# Patient Record
Sex: Male | Born: 1965 | Race: White | Hispanic: No | Marital: Single | Smoking: Never smoker
Health system: Southern US, Community
[De-identification: ages and names within clinical notes are randomized; demographics above are authoritative.]

## PROBLEM LIST (undated history)

## (undated) DIAGNOSIS — E119 Type 2 diabetes mellitus without complications: Secondary | ICD-10-CM

## (undated) DIAGNOSIS — I10 Essential (primary) hypertension: Secondary | ICD-10-CM

---

## 2011-08-16 ENCOUNTER — Ambulatory Visit: Payer: Self-pay | Admitting: Orthopedic Surgery

## 2014-01-15 ENCOUNTER — Emergency Department: Payer: Self-pay | Admitting: Emergency Medicine

## 2014-01-23 ENCOUNTER — Emergency Department: Payer: Self-pay | Admitting: Emergency Medicine

## 2014-01-23 LAB — COMPREHENSIVE METABOLIC PANEL
ALBUMIN: 4.1 g/dL (ref 3.4–5.0)
ALT: 22 U/L
Alkaline Phosphatase: 54 U/L
Anion Gap: 4 — ABNORMAL LOW (ref 7–16)
BILIRUBIN TOTAL: 0.6 mg/dL (ref 0.2–1.0)
BUN: 10 mg/dL (ref 7–18)
CHLORIDE: 110 mmol/L — AB (ref 98–107)
CO2: 27 mmol/L (ref 21–32)
CREATININE: 0.87 mg/dL (ref 0.60–1.30)
Calcium, Total: 8.5 mg/dL (ref 8.5–10.1)
EGFR (African American): >60
Glucose: 114 mg/dL — ABNORMAL HIGH (ref 65–99)
OSMOLALITY: 281 (ref 275–301)
Potassium: 3.3 mmol/L — ABNORMAL LOW (ref 3.5–5.1)
SGOT(AST): 18 U/L (ref 15–37)
SODIUM: 141 mmol/L (ref 136–145)
TOTAL PROTEIN: 7.7 g/dL (ref 6.4–8.2)

## 2014-01-23 LAB — CBC WITH DIFFERENTIAL/PLATELET
BASOS ABS: 0.1 10*3/uL (ref 0.0–0.1)
Basophil %: 0.8 %
EOS PCT: 1.4 %
Eosinophil #: 0.1 10*3/uL (ref 0.0–0.7)
HCT: 42.1 % (ref 40.0–52.0)
HGB: 14.9 g/dL (ref 13.0–18.0)
LYMPHS ABS: 2.2 10*3/uL (ref 1.0–3.6)
Lymphocyte %: 29.2 %
MCH: 32.4 pg (ref 26.0–34.0)
MCHC: 35.3 g/dL (ref 32.0–36.0)
MCV: 92 fL (ref 80–100)
MONO ABS: 0.5 x10 3/mm (ref 0.2–1.0)
Monocyte %: 6.2 %
Neutrophil #: 4.8 10*3/uL (ref 1.4–6.5)
Neutrophil %: 62.4 %
PLATELETS: 171 10*3/uL (ref 150–440)
RBC: 4.59 10*6/uL (ref 4.40–5.90)
RDW: 12.8 % (ref 11.5–14.5)
WBC: 7.7 10*3/uL (ref 3.8–10.6)

## 2014-01-28 LAB — CULTURE, BLOOD (SINGLE)

## 2014-01-29 ENCOUNTER — Emergency Department: Payer: Self-pay | Admitting: Emergency Medicine

## 2014-02-10 ENCOUNTER — Emergency Department: Payer: Self-pay | Admitting: Emergency Medicine

## 2014-02-11 ENCOUNTER — Ambulatory Visit: Payer: Self-pay | Admitting: Internal Medicine

## 2014-02-13 ENCOUNTER — Inpatient Hospital Stay: Payer: Self-pay | Admitting: Internal Medicine

## 2014-02-13 LAB — COMPREHENSIVE METABOLIC PANEL
ALK PHOS: 110 U/L
ANION GAP: 4 — AB (ref 7–16)
Albumin: 3.6 g/dL (ref 3.4–5.0)
BUN: 10 mg/dL (ref 7–18)
Bilirubin,Total: 0.6 mg/dL (ref 0.2–1.0)
CO2: 29 mmol/L (ref 21–32)
Calcium, Total: 9.5 mg/dL (ref 8.5–10.1)
Chloride: 102 mmol/L (ref 98–107)
Creatinine: 0.94 mg/dL (ref 0.60–1.30)
EGFR (Non-African Amer.): >60
GLUCOSE: 187 mg/dL — AB (ref 65–99)
Osmolality: 274 (ref 275–301)
Potassium: 3.8 mmol/L (ref 3.5–5.1)
SGOT(AST): 24 U/L (ref 15–37)
SGPT (ALT): 39 U/L
Sodium: 135 mmol/L — ABNORMAL LOW (ref 136–145)
Total Protein: 7.9 g/dL (ref 6.4–8.2)

## 2014-02-13 LAB — CBC
HCT: 40.8 % (ref 40.0–52.0)
HGB: 13.5 g/dL (ref 13.0–18.0)
MCH: 30.9 pg (ref 26.0–34.0)
MCHC: 33.1 g/dL (ref 32.0–36.0)
MCV: 94 fL (ref 80–100)
Platelet: 178 10*3/uL (ref 150–440)
RBC: 4.37 10*6/uL — AB (ref 4.40–5.90)
RDW: 12.9 % (ref 11.5–14.5)
WBC: 11.5 10*3/uL — AB (ref 3.8–10.6)

## 2014-02-14 LAB — CBC WITH DIFFERENTIAL/PLATELET
BASOS PCT: 0.7 %
Basophil #: 0.1 10*3/uL (ref 0.0–0.1)
EOS ABS: 0.3 10*3/uL (ref 0.0–0.7)
Eosinophil %: 3 %
HCT: 37.8 % — ABNORMAL LOW (ref 40.0–52.0)
HGB: 12.8 g/dL — ABNORMAL LOW (ref 13.0–18.0)
Lymphocyte #: 1.4 10*3/uL (ref 1.0–3.6)
Lymphocyte %: 13.6 %
MCH: 31.7 pg (ref 26.0–34.0)
MCHC: 34 g/dL (ref 32.0–36.0)
MCV: 93 fL (ref 80–100)
Monocyte #: 0.8 x10 3/mm (ref 0.2–1.0)
Monocyte %: 7.9 %
NEUTROS PCT: 74.8 %
Neutrophil #: 8 10*3/uL — ABNORMAL HIGH (ref 1.4–6.5)
PLATELETS: 156 10*3/uL (ref 150–440)
RBC: 4.05 10*6/uL — AB (ref 4.40–5.90)
RDW: 12.7 % (ref 11.5–14.5)
WBC: 10.6 10*3/uL (ref 3.8–10.6)

## 2014-02-14 LAB — BASIC METABOLIC PANEL
ANION GAP: 7 (ref 7–16)
BUN: 8 mg/dL (ref 7–18)
CREATININE: 0.84 mg/dL (ref 0.60–1.30)
Calcium, Total: 8.4 mg/dL — ABNORMAL LOW (ref 8.5–10.1)
Chloride: 106 mmol/L (ref 98–107)
Co2: 27 mmol/L (ref 21–32)
EGFR (African American): >60
EGFR (Non-African Amer.): >60
GLUCOSE: 143 mg/dL — AB (ref 65–99)
Osmolality: 280 (ref 275–301)
Potassium: 4 mmol/L (ref 3.5–5.1)
Sodium: 140 mmol/L (ref 136–145)

## 2014-02-14 LAB — HEMOGLOBIN A1C: Hemoglobin A1C: 7.2 % — ABNORMAL HIGH (ref 4.2–6.3)

## 2014-02-15 LAB — VANCOMYCIN, TROUGH: Vancomycin, Trough: 9 ug/mL — ABNORMAL LOW (ref 10–20)

## 2014-02-16 LAB — CREATININE, SERUM
Creatinine: 0.8 mg/dL (ref 0.60–1.30)
EGFR (African American): >60

## 2014-02-16 LAB — VANCOMYCIN, TROUGH: Vancomycin, Trough: 9 ug/mL — ABNORMAL LOW (ref 10–20)

## 2014-02-17 LAB — CBC WITH DIFFERENTIAL/PLATELET
BASOS PCT: 1.3 %
Basophil #: 0.1 10*3/uL (ref 0.0–0.1)
EOS PCT: 6.8 %
Eosinophil #: 0.6 10*3/uL (ref 0.0–0.7)
HCT: 39.6 % — AB (ref 40.0–52.0)
HGB: 13.8 g/dL (ref 13.0–18.0)
Lymphocyte #: 1.7 10*3/uL (ref 1.0–3.6)
Lymphocyte %: 17.9 %
MCH: 31.6 pg (ref 26.0–34.0)
MCHC: 34.8 g/dL (ref 32.0–36.0)
MCV: 91 fL (ref 80–100)
Monocyte #: 0.8 x10 3/mm (ref 0.2–1.0)
Monocyte %: 8 %
NEUTROS ABS: 6.2 10*3/uL (ref 1.4–6.5)
Neutrophil %: 66 %
Platelet: 204 10*3/uL (ref 150–440)
RBC: 4.36 10*6/uL — ABNORMAL LOW (ref 4.40–5.90)
RDW: 12.8 % (ref 11.5–14.5)
WBC: 9.4 10*3/uL (ref 3.8–10.6)

## 2014-02-17 LAB — CREATININE, SERUM
Creatinine: 0.74 mg/dL (ref 0.60–1.30)
EGFR (African American): >60
EGFR (Non-African Amer.): >60

## 2014-02-18 LAB — CULTURE, BLOOD (SINGLE)

## 2014-02-20 LAB — WOUND CULTURE

## 2014-03-04 ENCOUNTER — Inpatient Hospital Stay: Payer: Self-pay | Admitting: Internal Medicine

## 2014-03-04 LAB — BASIC METABOLIC PANEL
ANION GAP: 6 — AB (ref 7–16)
BUN: 13 mg/dL (ref 7–18)
Calcium, Total: 8.8 mg/dL (ref 8.5–10.1)
Chloride: 110 mmol/L — ABNORMAL HIGH (ref 98–107)
Co2: 26 mmol/L (ref 21–32)
Creatinine: 1.02 mg/dL (ref 0.60–1.30)
GLUCOSE: 130 mg/dL — AB (ref 65–99)
OSMOLALITY: 285 (ref 275–301)
POTASSIUM: 4 mmol/L (ref 3.5–5.1)
SODIUM: 142 mmol/L (ref 136–145)

## 2014-03-04 LAB — CBC
HCT: 40.1 % (ref 40.0–52.0)
HGB: 13.2 g/dL (ref 13.0–18.0)
MCH: 30.8 pg (ref 26.0–34.0)
MCHC: 33 g/dL (ref 32.0–36.0)
MCV: 93 fL (ref 80–100)
Platelet: 255 10*3/uL (ref 150–440)
RBC: 4.3 10*6/uL — ABNORMAL LOW (ref 4.40–5.90)
RDW: 12.8 % (ref 11.5–14.5)
WBC: 8.5 10*3/uL (ref 3.8–10.6)

## 2014-03-04 LAB — APTT
Activated PTT: 30 secs (ref 23.6–35.9)
Activated PTT: 92.2 secs — ABNORMAL HIGH (ref 23.6–35.9)

## 2014-03-04 LAB — TROPONIN I: Troponin-I: 0.06 ng/mL — ABNORMAL HIGH

## 2014-03-04 LAB — PROTIME-INR
INR: 1.1
PROTHROMBIN TIME: 14.4 s (ref 11.5–14.7)

## 2014-03-04 LAB — HEPARIN LEVEL (UNFRACTIONATED): Anti-Xa(Unfractionated): 0.71 IU/mL — ABNORMAL HIGH (ref 0.30–0.70)

## 2014-03-05 DIAGNOSIS — I059 Rheumatic mitral valve disease, unspecified: Secondary | ICD-10-CM

## 2014-03-05 LAB — BASIC METABOLIC PANEL
ANION GAP: 4 — AB (ref 7–16)
BUN: 13 mg/dL (ref 7–18)
CALCIUM: 8.1 mg/dL — AB (ref 8.5–10.1)
CREATININE: 0.92 mg/dL (ref 0.60–1.30)
Chloride: 111 mmol/L — ABNORMAL HIGH (ref 98–107)
Co2: 24 mmol/L (ref 21–32)
EGFR (African American): >60
EGFR (Non-African Amer.): >60
GLUCOSE: 136 mg/dL — AB (ref 65–99)
OSMOLALITY: 280 (ref 275–301)
POTASSIUM: 3.6 mmol/L (ref 3.5–5.1)
SODIUM: 139 mmol/L (ref 136–145)

## 2014-03-05 LAB — CBC WITH DIFFERENTIAL/PLATELET
BASOS PCT: 0.9 %
Basophil #: 0.1 10*3/uL (ref 0.0–0.1)
EOS PCT: 2.8 %
Eosinophil #: 0.2 10*3/uL (ref 0.0–0.7)
HCT: 36 % — ABNORMAL LOW (ref 40.0–52.0)
HGB: 12.4 g/dL — ABNORMAL LOW (ref 13.0–18.0)
LYMPHS ABS: 2.6 10*3/uL (ref 1.0–3.6)
LYMPHS PCT: 39.5 %
MCH: 31.8 pg (ref 26.0–34.0)
MCHC: 34.5 g/dL (ref 32.0–36.0)
MCV: 92 fL (ref 80–100)
Monocyte #: 0.4 x10 3/mm (ref 0.2–1.0)
Monocyte %: 6 %
NEUTROS PCT: 50.8 %
Neutrophil #: 3.3 10*3/uL (ref 1.4–6.5)
Platelet: 209 10*3/uL (ref 150–440)
RBC: 3.9 10*6/uL — AB (ref 4.40–5.90)
RDW: 13.1 % (ref 11.5–14.5)
WBC: 6.5 10*3/uL (ref 3.8–10.6)

## 2014-03-05 LAB — HEPARIN LEVEL (UNFRACTIONATED)
ANTI-XA(UNFRACTIONATED): 0.28 [IU]/mL — AB (ref 0.30–0.70)
Anti-Xa(Unfractionated): 0.23 IU/mL — ABNORMAL LOW (ref 0.30–0.70)
Anti-Xa(Unfractionated): 0.3 IU/mL (ref 0.30–0.70)

## 2014-03-05 LAB — LIPID PANEL
CHOLESTEROL: 168 mg/dL (ref 0–200)
HDL Cholesterol: 28 mg/dL — ABNORMAL LOW (ref 40–60)
Ldl Cholesterol, Calc: 109 mg/dL — ABNORMAL HIGH (ref 0–100)
TRIGLYCERIDES: 156 mg/dL (ref 0–200)
VLDL Cholesterol, Calc: 31 mg/dL (ref 5–40)

## 2014-03-05 LAB — TSH: Thyroid Stimulating Horm: 2.58 u[IU]/mL

## 2014-03-06 LAB — CBC WITH DIFFERENTIAL/PLATELET
Basophil #: 0.1 10*3/uL (ref 0.0–0.1)
Basophil %: 1 %
Eosinophil #: 0.1 10*3/uL (ref 0.0–0.7)
Eosinophil %: 2.6 %
HCT: 35.3 % — ABNORMAL LOW (ref 40.0–52.0)
HGB: 12.2 g/dL — ABNORMAL LOW (ref 13.0–18.0)
Lymphocyte #: 2.4 10*3/uL (ref 1.0–3.6)
Lymphocyte %: 40.7 %
MCH: 31.7 pg (ref 26.0–34.0)
MCHC: 34.7 g/dL (ref 32.0–36.0)
MCV: 92 fL (ref 80–100)
Monocyte #: 0.3 x10 3/mm (ref 0.2–1.0)
Monocyte %: 5.7 %
Neutrophil #: 2.9 10*3/uL (ref 1.4–6.5)
Neutrophil %: 50 %
PLATELETS: 204 10*3/uL (ref 150–440)
RBC: 3.86 10*6/uL — ABNORMAL LOW (ref 4.40–5.90)
RDW: 12.7 % (ref 11.5–14.5)
WBC: 5.8 10*3/uL (ref 3.8–10.6)

## 2014-03-06 LAB — PROTIME-INR
INR: 1.1
Prothrombin Time: 13.6 secs (ref 11.5–14.7)

## 2014-03-06 LAB — HEPARIN LEVEL (UNFRACTIONATED)
ANTI-XA(UNFRACTIONATED): 0.53 [IU]/mL (ref 0.30–0.70)
Anti-Xa(Unfractionated): 0.29 IU/mL — ABNORMAL LOW (ref 0.30–0.70)

## 2014-03-10 LAB — PSA: PSA: 0.9 ng/mL (ref 0.0–4.0)

## 2014-03-10 LAB — PROT IMMUNOELECTROPHORES(ARMC)

## 2014-03-10 LAB — CANCER ANTIGEN 19-9

## 2014-03-10 LAB — CEA: CEA: 0.7 ng/mL (ref 0.0–4.7)

## 2014-03-13 ENCOUNTER — Ambulatory Visit: Payer: Self-pay | Admitting: Internal Medicine

## 2014-03-19 ENCOUNTER — Ambulatory Visit: Payer: Self-pay | Admitting: Internal Medicine

## 2014-03-20 ENCOUNTER — Ambulatory Visit: Payer: Self-pay | Admitting: Family Medicine

## 2014-04-13 ENCOUNTER — Ambulatory Visit: Payer: Self-pay | Admitting: Family Medicine

## 2014-04-13 ENCOUNTER — Ambulatory Visit: Payer: Self-pay | Admitting: Internal Medicine

## 2014-09-04 ENCOUNTER — Emergency Department: Payer: Self-pay | Admitting: Emergency Medicine

## 2014-10-04 NOTE — Consult Note (Signed)
General Aspect Bilateral PE associated with left leg DVT   Present Illness The patient is a 49 year old male who underwent a right foot surgery about a month ago for an abscess.  He was discharged from the hospital on 09/07.  He reports that even during that timeframe he was ambulatory.  The patient reports that right foot has been healing well with daily wound care.  He reports he was doing well up until a couple of days ago when he started having shortness of breath with exertion.  His shortness of breath was worse at nighttime with lying flat.  He also had chest pressure.  He went to see his primary care provider yesterday and subsequently sent to the ED.  In the ED, a CT per PE protocol, which showed bilateral pulmonary emboli.  The patient otherwise denies any calf pain.  He did have an episode of calf pain on the left foot after his discharge but it was brief, but severe according to him.  Otherwise, he is denying any nausea, vomiting or diarrhea.  He denies any urinary symptoms.   PAST MEDICAL HISTORY:  1.  Recent diagnosis of diabetes with a hemoglobin A1c of 7.2 he was not discharged on any medications.  2.  History of MRSA of the right foot status post incision and drainage.   PAST SURGICAL HISTORY: Status post history of surgery in the right elbow and recent right foot incision and drainage.   Home Medications: Medication Instructions Status  sulfamethoxazole-trimethoprim 800 mg-160 mg oral tablet 1 tab(s) orally 2 times a day until open wound is healed Active  acetaminophen-HYDROcodone 325 mg-5 mg tablet 1 tab(s) orally 2 times a day, As Needed - for Pain Active  nystatin topical 100000 units/g topical cream Apply topically to affected area 2 times a day Active    No Known Allergies:   Case History:  Family History Non-Contributory   Social History positive tobacco (Greater than 1 year), negative ETOH, negative Illicit drugs   Review of Systems:  Fever/Chills No   Cough No    Sputum No   Abdominal Pain No   Diarrhea No   Constipation No   Nausea/Vomiting No   SOB/DOE Yes   Chest Pain Yes   Telemetry Reviewed NSR   Dysuria No   Tolerating Diet Yes   Physical Exam:  GEN well developed, well nourished, no acute distress   HEENT hearing intact to voice, moist oral mucosa   NECK supple  trachea midline   RESP normal resp effort  no use of accessory muscles   CARD regular rate  no JVD   ABD soft  nondistended   EXTR negative cyanosis/clubbing, minimal edema   SKIN No rashes, No ulcers, multiple tattoos   NEURO cranial nerves intact, follows commands, motor/sensory function intact   PSYCH alert, A+O to time, place, person, good insight   Nursing/Ancillary Notes: **Vital Signs.:   23-Sep-15 13:16  Vital Signs Type Routine  Temperature Temperature (F) 97.9  Celsius 36.6  Temperature Source oral  Pulse Pulse 94  Respirations Respirations 20  Systolic BP Systolic BP 415  Diastolic BP (mmHg) Diastolic BP (mmHg) 68  Mean BP 81  Pulse Ox % Pulse Ox % 93  Pulse Ox Activity Level  At rest  Oxygen Delivery 2L    20:33  Vital Signs Type Routine  Celsius 36.7  Temperature Source oral  Pulse Pulse 84  Respirations Respirations 20  Systolic BP Systolic BP 830  Diastolic BP (mmHg) Diastolic  BP (mmHg) 85  Mean BP 98  Pulse Ox % Pulse Ox % 96  Pulse Ox Activity Level  At rest  Oxygen Delivery 2L   Thyroid:  23-Sep-15 03:59   Thyroid Stimulating Hormone 2.58 (0.45-4.50 (IU = International Unit)  ----------------------- Pregnant patients have  different reference  ranges for TSH:  - - - - - - - - - -  Pregnant, first trimetser:  0.36 - 2.50 uIU/mL)  LabObservation:  23-Sep-15 08:48   OBSERVATION Reason for Test    10:13   OBSERVATION Reason for Test Positive Known Pulmonary Embolus  Cardiology:  23-Sep-15 08:48   Echo Doppler REASON FOR EXAM:     COMMENTS:     PROCEDURE: [place] - ECHO DOPPLER COMPLETE(TRANSTHOR)  - Mar 05 2014  8:48AM   RESULT: Echocardiogram Report  Patient Name:   James Odom Date of Exam: 03/05/2014 Medical Rec #:  [id number]        Custom1: Date ofBirth:  [birth year]     Height:       70.0 in Patient Age:    64 years      Weight:       211.0 lb Patient Gender: M             BSA:          2.14 m??  Indications: Pulmonary Emboli Sonographer:    [place] Referring Phys: Alric Seton  Summary:  1. Left ventricular ejection fraction, by visual estimation, is 55 to  60%.  2. Normal global left ventricular systolic function.  3. Mild left ventricular hypertrophy.  4. Moderately enlarged right ventricle with mildly reduced RV function.  5. Mild mitral valve regurgitation.  6. Mild to moderate tricuspid regurgitation.  7. Moderately elevated pulmonary artery systolic pressure. 2D AND M-MODE MEASUREMENTS (normal ranges within parentheses): Left Ventricle:          Normal IVSd (2D):      1.12 cm (0.7-1.1) LVPWd (2D):     1.13 cm (0.7-1.1) Aorta/LA:                  Normal LVIDd (2D):     5.10 cm (3.4-5.7) Aortic Root (2D): 4.10 cm (2.4-3.7) LVIDs (2D):     3.80 cm           Left Atrium (2D): 3.00 cm (1.9-4.0) LV FS (2D):     25.5 %   (>25%) LV EF (2D):     50.0 %   (>50%)                                   Right Ventricle:                                   RVd (2D): LV DIASTOLIC FUNCTION: MV Peak E: 0.88 m/s E/e' Ratio: 15.90 MV Peak A: 0.78 m/s DecelTime: 116 msec E/A Ratio: 1.13 SPECTRAL DOPPLER ANALYSIS (where applicable): Mitral Valve: MV P1/2 Time: 33.64 msec MV Area, PHT: 6.54 cm?? Aortic Valve: AoV Max Vel: 1.56 m/s AoV Peak PG: 9.7 mmHg AoV Mean PG: LVOT Vmax: 1.08 m/s LVOT VTI:  LVOT Diameter: Tricuspid Valve and PA/RV Systolic Pressure: TR Max Velocity: 3.37 m/s RA  Pressure: 10 mmHg RVSP/PASP: 55.4 mmHg  PHYSICIAN INTERPRETATION: Left Ventricle: The left ventricular internal cavity size was normal.  Mild left ventricular hypertrophy.  Global LV systolic  function was  normal. Left ventricular ejection fraction, by visual estimation, is 55  to 60%. Right Ventricle: The right ventricular size is moderately enlarged.  Global RV systolic function is mildly reduced. Left Atrium: The left atrium is normal in size. Right Atrium: The right atrium is normal in size. Pericardium: There is no evidence of pericardial effusion. Mitral Valve: The mitral valve is normal in structure. Mild mitral valve   regurgitation is seen. Tricuspid Valve: The tricuspid valve is normal. Mild to moderate  tricuspid regurgitation is visualized. The tricuspid regurgitant velocity  is 3.37 m/s, and with an assumed right atrial pressure of 10 mmHg, the  estimated right ventricular systolic pressure is moderately elevated at  55.4 mmHg. Aortic Valve: The aortic valve is structurally normal, with no evidence  of sclerosis or stenosis. No evidence of aortic valve regurgitation is  seen. Pulmonic Valve: The pulmonic valve isnormal. Trace pulmonic valve  regurgitation. Aorta: The aortic root and ascending aorta are structurally normal, with  no evidence of dilitation.  [id number] Ida Rogue MD Electronically signed by [id number] Ida Rogue MD Signature Date/Time: 03/05/2014/6:41:40 PM  *** Final ***  IMPRESSION: .    Verified By: Minna Merritts, M.D., MD  Routine Chem:  23-Sep-15 03:59   Glucose, Serum  136  BUN 13  Creatinine (comp) 0.92  Sodium, Serum 139  Potassium, Serum 3.6  Chloride, Serum  111  CO2, Serum 24  Calcium (Total), Serum  8.1  Anion Gap  4  Osmolality (calc) 280  eGFR (African American) >60  eGFR (Non-African American) >60 (eGFR values <18m/min/1.73 m2 may be an indication of chronic kidney disease (CKD). Calculated eGFR is useful in patients with stable renal function. The eGFR calculation will not be reliable in acutely ill patients when serum creatinine is changing rapidly. It is not useful in  patients on dialysis. The eGFR  calculation may not be applicable to patients at the low and high extremes of body sizes, pregnant women, and vegetarians.)  Cholesterol, Serum 168  Triglycerides, Serum 156  HDL (INHOUSE)  28  VLDL Cholesterol Calculated 31  LDL Cholesterol Calculated  109 (Result(s) reported on 05 Mar 2014 at 04:43AM.)  Routine Hem:  23-Sep-15 03:59   WBC (CBC) 6.5  RBC (CBC)  3.90  Hemoglobin (CBC)  12.4  Hematocrit (CBC)  36.0  Platelet Count (CBC) 209  MCV 92  MCH 31.8  MCHC 34.5  RDW 13.1  Neutrophil % 50.8  Lymphocyte % 39.5  Monocyte % 6.0  Eosinophil % 2.8  Basophil % 0.9  Neutrophil # 3.3  Lymphocyte # 2.6  Monocyte # 0.4  Eosinophil # 0.2  Basophil # 0.1 (Result(s) reported on 05 Mar 2014 at 04:56AM.)   XRay:    22-Sep-15 11:02, Chest Portable Single View  Chest Portable Single View   REASON FOR EXAM:    chest pain  COMMENTS:       PROCEDURE: DXR - DXR PORTABLE CHEST SINGLE VIEW  - Mar 04 2014 11:02AM     CLINICAL DATA:  Chest pain and shortness of breath.    EXAM:  PORTABLE CHEST - 1 VIEW    COMPARISON:  None.    FINDINGS:  The heart size and mediastinal contours are within normal limits.  Both lungs are clear. The visualized skeletal structures are  unremarkable.     IMPRESSION:  Normal chest.      Electronically Signed    By: JRozetta NunneryM.D.    On:  03/04/2014 11:32         Verified By: Larey Seat, M.D.,  [place]:    23-Sep-15 10:13, [place] Color Flow Doppler Low Extrem Bilat (Legs)  [place] Color Flow Doppler Low Extrem Bilat (Legs)   REASON FOR EXAM:    Positive Known Pulmonary Embolus  COMMENTS:       PROCEDURE: [place]  - US DOPPLER LOW EXTR BILATERAL  - Mar 05 2014 10:13AM     CLINICAL DATA:  Acute pulmonary emboli    EXAM:  BILATERAL LOWER EXTREMITY VENOUS DOPPLER ULTRASOUND    TECHNIQUE:  Gray-scale sonography with graded compression, as well as color  Doppler and duplex ultrasound were performed to evaluate the lower  extremity deep venous systems  from the level of the common femoral  vein and including the common femoral, femoral, profunda femoral,  popliteal and calf veins including the posterior tibial, peroneal  and gastrocnemius veins when visible. The superficial great  saphenous vein was also interrogated. Spectral Doppler was utilized  to evaluate flow at rest and with distal augmentation maneuvers in  the common femoral, femoral and popliteal veins.    COMPARISON:  None.    FINDINGS:  RIGHT LOWER EXTREMITY    Common Femoral Vein: No evidence of thrombus. Normal  compressibility, respiratory phasicity and response to augmentation.    Saphenofemoral Junction: No evidence of thrombus. Normal  compressibility and flow on color Doppler imaging.    Profunda Femoral Vein: No evidence of thrombus. Normal  compressibility and flow on color Doppler imaging.    Femoral Vein: No evidence of thrombus. Normal compressibility,  respiratory phasicity and response to augmentation.    Popliteal Vein: No evidence of thrombus. Normal compressibility,  respiratory phasicity and response to augmentation.    Calf Veins: Noevidence of thrombus. Normal compressibility and flow  on color Doppler imaging.    Superficial Great Saphenous Vein: No evidence of thrombus. Normal  compressibility and flow on color Doppler imaging.    Venous Reflux:  None.    Other Findings:  None.    LEFT LOWER EXTREMITY    Common Femoral Vein: No evidence of thrombus. Normal  compressibility, respiratory phasicity and response to augmentation.    Saphenofemoral Junction: No evidence of thrombus. Normal  compressibility and flow on color Doppler imaging.    Profunda Femoral Vein: No evidence of thrombus. Normal  compressibility and flow on color Doppler imaging.    Femoral Vein: Left distal femoral vein demonstrates intraluminal  echogenic thrombus appearing acute which is noncompressible.  Thrombus is also occlusive.    Popliteal Vein: Femoral  thrombus extends into the popliteal vein  which is noncompressible and occlusive.    Calf Veins: Posterior tibial and peroneal calf veins also  demonstrate occlusive thrombus.    Superficial Great Saphenous Vein: No evidence of thrombus. Normal  compressibility and flow on color Doppler imaging.  Venous Reflux:  None.    Other Findings: Enlarged proximal right thigh lymph node measures  3.2 x 1.0 x 2.7 cm. Although the lymph node is enlarged at has a  preserved cortex and fatty hilum, suspect reactive.     IMPRESSION:  Positive exam for acute occlusive left distal femoral, popliteal,  tibial and peroneal DVT.    Negative for right lower extremity DVT.      Electronically Signed    By: Daryll Brod M.D.    On: 03/05/2014 10:22         Verified By: Earl Gala, M.D.,    Impression  1.  Acute bilateral pulmonary emboli possibly due to surgery, but the patient was ambulatory.  Currently he is on heparin and tolerating anticoagulation without difficulty.   He does not have mobile thrombus in the left leg and there are minimal right sided changes noted on the echo.  Therefore I do not feel a filter is indicated at this time.  Should problems arrise with his anticoagulation or if he need further surgery on his foot then the filter will be needed.  I would favor Xarelto  or Eliquis if it is affordable over Coumadin and he will discuss this with DC planner tomorrow. The hypercoagulable panel is pending.   2.  Diabetes, which was recently diagnosed, hemoglobin 7.2.  It is possible that the patient may be able to lose weight and follow dietary advice and might not need to be on any treatment.  The patient has cut down significantly on his carbohydrate intake.  He is willing to change his lifestyle dramatically.  3.  Right-sided foot infection. Continue Bactrim appears to be healing.  4.  Obesity  nutritional conselling   Plan level 3   Electronic Signatures: Hortencia Pilar (MD)   (Signed 23-Sep-15 22:56)  Authored: General Aspect/Present Illness, Home Medications, Allergies, History and Physical Exam, Vital Signs, Labs, Radiology, Impression/Plan   Last Updated: 23-Sep-15 22:56 by Hortencia Pilar (MD)

## 2014-10-04 NOTE — Discharge Summary (Signed)
PATIENT NAME:  James Odom, James Odom MR#:  [id number] DATE OF BIRTH:  [birth year]  PRIMARY CARE PHYSICIAN: Nonlocal.   DISCHARGE DIAGNOSES: 1.  Right foot abscess and cellulitis.  2.  Diabetes, new diagnosis.   CONDITION: Stable.   CODE STATUS: Full code.   HOME MEDICATIONS: Bactrim 800 mg-160 mg p.o. tablets twice a day for 10 days; acetaminophen-hydrocodone 325 mg/5 mg p.o. tablets every 6 hours p.r.n.   PROCEDURE:  Right foot abscess incision and debridement.   CONSULTATION:  James Odom, DPM  The patient in need of home health nursing for wound care, dressing change.   DIET: Low-fat, low-cholesterol, ADA diet.   ACTIVITY: As tolerated.   FOLLOWUP CARE:  Follow up with PCP and James Odom, podiatrist, within 1 to 2 weeks.   REASON FOR ADMISSION:  Right foot swelling and redness.   HOSPITAL COURSE: The patient is a 49 year old Caucasian male with no past medical history, comes to the hospital secondary to right foot erythema and swelling for more than a week. Initially, the patient was treated with p.o. Bactrim for a few days without improvement. For detailed history and physical examination, please refer to the admission note dictated by Dr. Nemiah Odom.    LABORATORY DATA: On admission date was unremarkable.  1.  Right foot x-ray shows soft tissue swelling. After admission, the patient had been treated with  Unasyn, vancomycin. Since patient continuously had right foot erythema and pain, I requested a podiatry consult to rule out abscess.  James Odom evaluated the patient and suggested MRI of right foot which showed no osteomyelitis, but the abscess. James Odom did an incision and debridement yesterday.  After the procedure, the patient had no complaints  His right foot is in dressing. According to James Odom, patient needs continued Bactrim and wound care as outpatient.   2.  Diabetes. The patient has no history of diabetes. The patient's hemoglobin A1c is 7.2, so patient was diagnosed with  diabetes.  The patient was advised diet-controlled with ADA diet and a low-fat, low-cholesterol diet. The patient has no complaints. Vital signs are stable.   Physical examination is unremarkable.  He is clinically stable, will be discharged to home with home health today. I discussed the patient's discharge plan with James Odom, nurse, and case   manager.  TIME SPENT: About 38 minutes.    ____________________________ James PollackQing Reda Gettis, MD qc:LT D: 02/17/2014 13:06:00 ET T: 02/17/2014 17:38:41 ET JOB#: [id number]  cc: James PollackQing Tarynn Garling, MD, <Dictator> James PollackQING Larry Alcock MD ELECTRONICALLY SIGNED 02/21/2014 12:44

## 2014-10-04 NOTE — H&P (Signed)
PATIENT NAME:  James Odom, James Odom MR#:  [id number] DATE OF BIRTH:  1966-01-13  DATE OF ADMISSION:  02/13/2014  ADMITTING PHYSICIAN: Gladstone Lighter, MD  PRIMARY CARE PHYSICIAN: None.   CHIEF COMPLAINT: Right foot swelling and redness.  HISTORY OF PRESENT ILLNESS: James Odom is a 49 year old Caucasian male with no significant past medical history who comes to the hospital secondary to right foot erythema and swelling going on for more than a week now. Initially it started with redness and swelling of the 3rd and 4th toes after possible folliculitis infection. He presented to the ER a few days ago and was discharged on p.o. Bactrim; however, the swelling spread to the entire foot along with swelling about the ankle. He has been resting at home keeping his leg elevated in spite of which no improvement in symptoms so presented to the ER again.   PAST MEDICAL HISTORY: No significant past medical history.   PAST SURGICAL HISTORY: Right elbow tendon repair.   ALLERGIES: PLAGUE VACCINE.  CURRENT HOME MEDICATIONS: Bactrim and pain medications, as dispensed by the ER week this week.   SOCIAL HISTORY: Lives at home. Works as a Training and development officer. Quit smoking more than 15 years ago. Occasional alcohol use.   FAMILY HISTORY: Significant for renal failure and brother and dad died from prostate cancer. Aunt with bone cancer.   REVIEW OF SYSTEMS: CONSTITUTIONAL: Positive for subjective fevers and chills. No fatigue or weakness.  EYES: No blurred vision, double vision, inflammation or glaucoma.  ENT: No tinnitus, ear pain, hearing loss, epistaxis or discharge.  RESPIRATORY: No cough, wheeze, hemoptysis or COPD. CARDIOVASCULAR: No chest pain, orthopnea, edema, arrhythmia, palpitations, or syncope.  GASTROINTESTINAL: No nausea, vomiting, diarrhea, abdominal pain, hematemesis, or melena.  GENITOURINARY: No dysuria, hematuria, renal calculus, frequency, or incontinence.  ENDOCRINE: No polyuria, nocturia, thyroid  problems, heat or cold intolerance.  HEMATOLOGY: No anemia, easy bruising or bleeding.  SKIN: Other than the erythema of the foot, no other acne, rash or lesions.  MUSCULOSKELETAL: No neck, back, shoulder pain, arthritis or gout.  NEUROLOGIC: No numbness, weakness, CVA, TIA or seizures.  PSYCHOLOGICAL: No anxiety, insomnia or depression.   PHYSICAL EXAMINATION: VITAL SIGNS: Temperature 97.9 degrees Fahrenheit, pulse 85, respirations 20, blood pressure 125/72, pulse ox 96% on room air.  GENERAL: Well-built, well-nourished male lying in bed, not in any acute distress.  HEENT: Normocephalic, atraumatic. Pupils equal, round and reacting to light. Anicteric sclerae. Extraocular movements intact. Oropharynx is clear without erythema, mass or exudates.  NECK: Supple. No thyromegaly, JVD or carotid bruits. No lymphadenopathy.  LUNGS: Moving air bilaterally. No wheeze or crackles. No use of accessory muscles for breathing.  CARDIOVASCULAR: S1 and S2 regular rate and rhythm. No murmurs, rubs, or gallops.  ABDOMEN: Soft, nontender, nondistended. No hepatosplenomegaly. Normal bowel sounds.  EXTREMITIES: Left foot no pedal edema. No clubbing or cyanosis. Dorsalis pedis pulse is palpable on that side. The right dorsum of the foot has significant erythema and edema spreading up to the ankle. There is a brown spot which is healed on the 3rd toe which probably was the site of the folliculitis. Athletes foot with mild fungal infection in between the toes noted as well. SKIN: No acne, rash or lesions.  LYMPHATICS: No cervical lymphadenopathy.  NEUROLOGIC: Cranial nerves intact. No focal motor or sensory deficits.  PSYCHOLOGICAL: The patient is awake, alert and oriented x3.   DIAGNOSTIC DATA: WBC 11.5, hemoglobin 13.5, hematocrit 40.8, platelet count 178,000.   Sodium 135, potassium 3.8, chloride 102, bicarb 29, BUN  10, creatinine 0.94, glucose 187 and calcium 9.5. ALT 39, AST 24, alk phos 110, total bilirubin  0.6, albumin 3.6.  Right foot x-ray showing soft tissue swelling, diffuse. No radiopaque foreign body. Diffuse degenerative changes. No acute bony abnormality noted.   ASSESSMENT AND PLAN: A 49 year old male with no significant past medical history who comes with worsening right foot cellulitis failing outpatient treatment.  1.  Right foot cellulitis. Started as folliculitis. No trauma reported. Failed outpatient Bactrim. Now spreading so start on IV fluids, get blood cultures, and give vancomycin for now. Mark the site and monitor.  No evidence of  osteomyelitis. Pain meds p.r.n.  2.  Deep vein thrombosis prophylaxis with Lovenox.  CODE STATUS: FULL.  TIME SPENT ON ADMISSION: 50 minutes.   ____________________________ Gladstone Lighter, MD rk:sb D: 02/13/2014 14:39:59 ET T: 02/13/2014 15:02:59 ET JOB#: [id number]  cc: Gladstone Lighter, MD, <Dictator> Gladstone Lighter MD ELECTRONICALLY SIGNED 02/17/2014 17:23

## 2014-10-04 NOTE — Op Note (Signed)
PATIENT NAME:  James Odom, [place] MR#:  [id number] DATE OF BIRTH:  11-May-1966  DATE OF PROCEDURE:  02/16/2014  SURGEON:  Ricci Barkerodd W. Aleaha Fickling, DPM.  PREOPERATIVE DIAGNOSIS:  Abscess, right foot.   POSTOPERATIVE DIAGNOSIS:  Abscess, right foot.  PROCEDURE:  Incision and drainage abscess, right foot.   ANESTHESIA:  General.   HEMOSTASIS:  Pneumatic tourniquet, right ankle, 250 mmHg.   ESTIMATED BLOOD LOSS:  Minimal.   PATHOLOGY:  None.   CULTURES:  Deep wound cultures, right foot abscess.   DRAINS:  4 x 4 gauze with sterile saline.   COMPLICATIONS:  None apparent.   OPERATIVE INDICATIONS: This is a 49 year old male with a recent history of redness and swelling in his right foot. Unresponsive to antibiotic therapy. MRI was obtained which did reveal an abscess along the dorsal aspect of the right foot over the 3rd metatarsal area. Decision was made for incision and drainage of the right foot.   OPERATIVE PROCEDURE: The patient was taken to the operating room and placed on the table in the supine position. Following general anesthesia the right foot was prepped and draped in the usual sterile fashion with a tourniquet at the level of the right ankle. The foot was exsanguinated and the tourniquet inflated to 250 mmHg.   Attention was then directed to the dorsal aspect of the right foot where an approximate 6-cm linear incision was made coursing proximal to distal over the 3rd metatarsophalangeal joint and the base of the 3rd toe. Immediately there was a heavy amount of purulent discharge from the incision. A culture was taken. The incision was opened up down to the level of the tendinous structures across the dorsal aspect of the joint. The purulence was grossly removed using a Ray-Tec sponge and then the remaining tissues were excisionally debrided with a Versajet debrider on a setting of 5-6. The debridement was carried down to the level of the deep subcutaneous tissues and tendinous structures but did  not reach the capsule or bone.   The wound was then thoroughly irrigated with the remaining 3 liters of sterile saline under pulsed irrigation. The proximal and middle and distal aspects of the incision were closed using 4-0 nylon simple interrupted sutures with 2 areas left open and within the incision for packing. These were then packed with sterile saline-soaked gauze. Then 4 x 4s, Fluffs, ABDs and Kerlix were applied to the right foot. The tourniquet was released. An Ace wrap was then applied for compression. The patient tolerated the procedure and anesthesia well and was transported to PACU with vital signs stable and in good condition.    ____________________________ James Odom, DPM tc:lt D: 02/16/2014 08:47:33 ET T: 02/16/2014 09:38:16 ET JOB#: [id number]  cc: James Galasodd Nazaria Riesen, DPM, <Dictator> Whitney Hillegass DPM ELECTRONICALLY SIGNED 03/03/2014 8:54

## 2014-10-04 NOTE — H&P (Signed)
PATIENT NAME:  James Odom, James Odom MR#:  [id number] DATE OF BIRTH:  May 26, 1966  DATE OF ADMISSION:  03/04/2014  PRIMARY CARE PROVIDER:  New MD, he is not sure of the name.   EMERGENCY DEPARTMENT REFERRING PHYSICIAN:  Toney RakesEryka Gayle, MD   CHIEF COMPLAINT: Chest pain, shortness of breath.   HISTORY OF PRESENT ILLNESS: The patient is a 49 year old white male who underwent a right foot surgery about a month ago for an abscess.  He was in the hospital and discharged on 09/07. At that time, he was in the hospital from 02/13/2014 until 02/17/2014.  The patient had incision and drainage.  He reports that even during that timeframe he was ambulatory.  The patient reports that right foot has been healing well and was doing well up until a couple of days ago when he started having shortness of breath with exertion, but his shortness of breath was worse at nighttime with lying flat.  He also had chest pressure.  He went to see his primary care provider today, which is a new provider for him, and they did an EKG which was concerning, was sent to the ED.  In the ED, he was evaluated by the ED physician and had a CT per PE protocol, which showed bilateral pulmonary emboli, consistent with at least a massive PE.  The patient otherwise denies any calf pain.  He did have an episode of calf pain on the left foot after his discharge but it was brief, but severe according to him.  Otherwise, he is denying any nausea, vomiting or diarrhea.  He denies any urinary symptoms.   PAST MEDICAL HISTORY:  1.  Recent diagnosis of diabetes with a hemoglobin A1c of 7.2 he was not discharged on any medications.  2.  History of MRSA of the right foot status post incision and drainage.   PAST SURGICAL HISTORY: Status post history of surgery in the right elbow and recent right foot incision and drainage.   ALLERGIES: None.   CURRENT MEDICATIONS: He is on Bactrim 1 tab p.o. b.i.d., nystatin topically applied to affected area b.i.d.,  acetaminophen hydrocodone 325/5 mg 1 tab p.o. b.i.d. p.r.n. for pain.   SOCIAL HISTORY: History of smoking, quit 15 years ago.  No alcohol or drug use.   FAMILY HISTORY: There is no history of pulmonary embolism or deep vein thrombosis in his family. There is a history of prostate cancer and diabetes.   REVIEW OF SYSTEMS.  CONSTITUTIONAL: Denies any fevers, chills. No weight gain, weight loss.  HEENT: Denies any visual difficulties. No blurred vision. No cataracts. No glaucoma.   ENT: Denies any nasal drainage. No seasonal allergies.  EARS: Denies any ringing in the ears.  Denies any hearing difficulties.  OROPHARYNX: Denies any difficulty swallowing.  CARDIOVASCULAR: Denies any chest pain, palpitations or syncope.  PULMONARY: Denies any cough, complains of shortness of breath. No wheezing. No chronic obstructive pulmonary disease, no pneumonia.  GASTROINTESTINAL: Denies any nausea, vomiting, diarrhea.  SKIN:  Still has some erythema in the right foot, but no warmth.  LYMPHATICS: Denies any lymph node enlargement.  VASCULAR: Denies any claudication symptoms.  NEUROLOGIC: Denies any CVA, TIA, or seizures.  PSYCHIATRIC: Denies any anxiety, insomnia, or ADD.   PHYSICAL EXAMINATION:  VITAL SIGNS: Temperature 97.8, pulse 97, respirations 19, blood pressure 109/88.  GENERAL: The patient is a well-developed male. Currently, not in any acute distress.  HEENT: Head atraumatic, normocephalic.  Pupils equally round, reactive to light and accommodation.  There is no  conjunctival pallor. No scleral icterus. Nasal exam shows no drainage or ulceration.  OROPHARYNX: Clear without any exudate.  NECK:  Supple, without any JVD. No thyromegaly.  CARDIOVASCULAR: Regular rate and rhythm. No murmurs, rubs, clicks, or gallops.  LUNGS: Clear to auscultation bilaterally without any rales, rhonchi, wheezing.  ABDOMEN: Soft, nontender, nondistended. Positive bowel sounds x 4.  EXTREMITIES: No clubbing, cyanosis,  or edema.  SKIN: He has a right foot that is slightly erythematous at the site of the incision, but appears to be healing well. There is no warmth.  LYMPH NODES: Nonpalpable.  NEUROLOGIC: Awake, alert, oriented x 3. No focal deficits.  PSYCHIATRIC: Not anxious or depressed.   LABORATORY DATA:    CT per PE protocol shows positive for acute pulmonary embolism with right-sided heart strain consistent with submassive pulmonary embolism.  Glucose 130, BUN 13, creatinine 1.02, sodium 142, potassium 4.0, chloride 110, CO2 is 26, calcium 8.8. Troponin 0.06. WBC 8.5, hemoglobin 13.2, platelet count 255. INR 1.1.   ASSESSMENT AND PLAN: The patient is 49 year old white male who had foot incision and drainage beginning of September but is now presenting with shortness of breath, chest pressure, noted to have bilateral pulmonary emboli.  1.  Acute bilateral pulmonary emboli possibly due to surgery, but the patient was ambulatory.  He could also have underlying hypercoagulable state. At this time, we will go ahead and check antiphospholipid syndrome antibodies. Also check factor V Leiden. Prothrombin gene mutation as well as protein C and S panel. We will get a Doppler of his lower extremities and an echocardiogram of the heart to further assess the heart strain. I will ask hematology oncology to come evaluate the patient to assist with duration of therapy on this patient as well as outpatient follow-up.  2.  Diabetes, which was recently diagnosed, hemoglobin 7.2.  It is possible that the patient may be able to lose weight and follow dietary advice and might not need to be on any treatment.  The patient has cut down significantly on his carbohydrate intake.  He is willing to change his lifestyle dramatically.  3.  Right-sided foot infection. Continue Bactrim appears to be healing.  4.  Miscellaneous. The patient will be on heparin drip, which should suffice for deep vein thrombosis prophylaxis.    TIME SPENT: 60  minutes spent on this H and P.  ____________________________ Lacie Scotts. Allena Katz, MD shp:DT D: 03/04/2014 13:32:38 ET T: 03/04/2014 13:43:40 ET JOB#: [id number]  cc: Pansie Guggisberg H. Allena Katz, MD, <Dictator> Charise Carwin MD ELECTRONICALLY SIGNED 03/08/2014 7:58

## 2014-10-04 NOTE — Consult Note (Signed)
PATIENT NAME:  James Odom, James Odom MR#:  [id number] DATE OF BIRTH:  November 16, 1965  DATE OF CONSULTATION:  03/04/2014  REFERRING PHYSICIAN:  Shreyang H. Allena KatzPatel, MD  CONSULTING PHYSICIAN:  Chloe Flis R. Sherrlyn HockPandit, MD  REASON FOR CONSULTATION: Pulmonary emboli.   HISTORY OF PRESENT ILLNESS: The patient is a 49 year old gentleman with a past medical history significant for recently diagnosed diabetes, recent MRSA infection of the right foot status post incision and drainage earlier this month. The patient currently admitted to hospital earlier today with a complaint of persistent chest pain and progressive shortness of breath on exertion and at rest. He had a CT scan of the chest done, which showed pulmonary emboli bilaterally, consistent with at least a massive PE. The patient states that he works as a Investment banker, operationalchef and had been doing well until recently, when he developed a pimple-sized red area of infection on the dorsum of the right foot, which quickly enlarged in size and redness and caused severe swelling and pain. He was told he had cellulitis and given antibiotics, and then subsequently had to undergo incision and drainage. States that he did try to remain ambulatory throughout this and denies being sedentary or bedridden since he had the surgical procedure. He then developed chest pain and shortness of breath and has been admitted with a pulmonary embolism. Currently states that his dyspnea is slowly improving. Denies any major chest pain. He is on heparin IV, denies any obvious bleeding issues. No fevers or chills. No night sweats. States that his father had prostate cancer in his 49s. He denies any new constipation, change in caliber of stools, bright red blood in stools, melena, dysuria or hematuria, new bone pains. Appetite is good, no unintentional weight loss. Denies any family history of thromboembolic disorders or stroke or heart attack at early age.   PAST MEDICAL HISTORY AND SURGICAL HISTORY: As in HPI above.    FAMILY HISTORY: As in HPI above. Also remarkable for diabetes.   SOCIAL HISTORY: Ex-smoker, quit 15 years ago. Denies alcohol or recreational drug usage. Works as a Investment banker, operationalchef.   ALLERGIES: None.   HOME MEDICATIONS: Bactrim 1 tablet p.o. b.i.d., nystatin topically applied to groin area rash b.i.d., Norco 325/5 mg 1 tablet b.i.d. p.r.n. for pain.   REVIEW OF SYSTEMS:  CONSTITUTIONAL: As in HPI. Denies any fevers or chills. No night sweats.  HEENT: No new headaches or dizziness. No epistaxis, ear or jaw pain.  CARDIAC: No angina, palpitation, orthopnea, or PND.  LUNGS: As in HPI above. No hemoptysis.  GASTROINTESTINAL: No nausea, vomiting, or diarrhea. No bright red blood in stools or melena.  GENITOURINARY: No dysuria or hematuria.  SKIN: No new rashes or pruritus.  MUSCULOSKELETAL: As in HPI.  EXTREMITIES: Recent right leg swelling status post incision and drainage.  NEUROLOGIC: No new focal weakness, seizures, or loss of consciousness.  ENDOCRINE: No polyuria or polydipsia. Appetite is good.   PHYSICAL EXAMINATION:  GENERAL: Patient is a moderately built and well-nourished individual, sitting in bed, alert and oriented and converses appropriately. No icterus. No pallor.  VITAL SIGNS: Temperature 97.5, heart rate 92, respirations 20, blood pressure 116/87, saturating 94% on 2 L oxygen.  HEENT: Normocephalic, atraumatic. Extraocular movements intact. No oral thrush.  NECK: Negative for lymphadenopathy.  CARDIOVASCULAR: S1, S2, regular rate and rhythm.  LUNGS: Show bilateral good air entry, no crepitations or rhonchi.  ABDOMEN: Soft, nontender. No hepatosplenomegaly.  EXTREMITIES: Show mild edema in the right foot and leg, recent incision in the dorsum of the right  foot with mild erythema and open wound. No left lower extremity edema.  SKIN: Shows no other generalized rashes or major bruising.  LYMPHATICS: No adenopathy in axillary or inguinal areas.  NEUROLOGIC: Cranial nerves intact,  moves all extremities spontaneously.  MUSCULOSKELETAL: No obvious joint redness or swelling.   LABORATORY RESULTS: Hemoglobin 13.2, platelets 255,000, WBC 8500. Creatinine 1.02, calcium 8.8, potassium 4.0. Baseline INR 1.1, PTT 30.0.   IMPRESSION AND RECOMMENDATIONS: A 49 year old gentleman with a past medical history as described above status post recent incision and drainage procedure for persistent right foot cellulitis and abscess during hospitalization September 3 to September 7. He is now admitted with progressive respiratory symptoms and diagnosed to have massive bilateral pulmonary embolism. The patient denies taking any long road trips or plane rides, denies being sedentary even after having surgical procedure on his right foot. He denies family history of thromboembolic phenomena. Given recent surgical procedure, time-wise most likely this could be a postoperative episode of thromboembolism, but given that he has been ambulatory, he definitely needs further workup to rule out underlying hypercoagulable state. Laboratories have already been sent for antiphospholipid antibody, protein C and S panel, factor II mutation analysis, factor V Leiden mutation. We will also request a beta-2 glycoprotein 1 panel, serum homocysteine with a.m. labs, and also check for tumor markers including CA-19-9 (given recent history of diabetes), CEA, PSA (his father had prostate cancer in his 49s), and SIEP to rule out monoclonal paraproteinemia. Agree with ongoing anticoagulation. We will also request bilateral lower extremity Dopplers tomorrow to rule out lower extremity deep vein thrombosis. If he is discharged soon, we will schedule followup in 1-2 weeks after discharge to discuss results of hypercoagulable workup. The patient is agreeable to this plan.   Thank you for the referral. Please feel free to contact me if additional questions.    ____________________________ Maren Reamer Sherrlyn Hock, MD srp:ST D: 03/04/2014  22:24:47 ET T: 03/04/2014 23:06:21 ET JOB#: [id number]  cc: Darryll Capers R. Sherrlyn Hock, MD, <Dictator> Wille Celeste MD ELECTRONICALLY SIGNED 03/05/2014 15:20

## 2014-10-04 NOTE — Discharge Summary (Signed)
PATIENT NAME:  Shella MaximREYNOLDS, Danyal MR#:  [id number] DATE OF BIRTH:  March 01, 1966  DATE OF ADMISSION:  03/04/2014 DATE OF DISCHARGE:  03/06/2014  [facility] [address].    FINAL DIAGNOSES: 1.  Submassive pulmonary embolism and left leg deep vein thrombosis.  2.  Previous infection of the right foot.  3.  Diabetes.   MEDICATIONS ON DISCHARGE: Include Percocet 5/325 one tablet twice a day as needed, Bactrim 1 tablet twice a day until open wound has healed, nystatin topical applied to affected area twice a day, Xarelto 15 mg starter pack 1 tablet twice a day with meals for 21 days, then 20 mg p.o. daily afterwards.   DIET: Carbohydrate-controlled diet, regular consistency.   ACTIVITY: As tolerated.   FOLLOWUP: Dr. Sherrlyn HockPandit hematology to follow up on hypercoagulable workup. Dr. Meredeth IdeFleming, pulmonary, in 1-2 weeks, [facility] on [address].   HOSPITAL COURSE: The patient was admitted 03/04/2014, discharged 03/06/2014. Came in with chest pain and shortness of breath, found to have submassive PE. A hypercoagulable workup sent off. Likely the etiology of the pulmonary embolism and DVT as he was in the hospital and had a right foot abscess drained; he refused the Lovenox injections at that time, but he did have the SCDs on. He was found to have a PE, started initially on heparin drip, seen in consultation by Dr. Sherrlyn HockPandit who agreed with the hypercoagulable workup. Dr. Gilda CreaseSchnier, vascular surgery, did not believe the patient needed a filter. Dr. Meredeth IdeFleming pulmonary, since the patient was hemodynamically stable, no need for thrombolysis. Dr. Meredeth IdeFleming stated that he is okay to switch over to Xarelto upon discharge.   LABORATORY AND RADIOLOGICAL DATA DURING THE HOSPITAL COURSE: Included an EKG showing normal sinus rhythm, left axis deviation. PT, INR, and PTT normal range. Glucose 130, BUN 13, creatinine 1.02, sodium 142, potassium 4.0, chloride 110, CO2 of 26, calcium  8.8. White blood cell count 8.5, hemoglobin and hematocrit 13.2 and 40.1, platelet count of 255,000. Troponin borderline at 0.06. Chest x-ray negative. CT scan of the chest, submassive pulmonary embolism with right heart strain, bilateral small pulmonary nodules. Recommend CT scan of the chest in 6 to 12 months. Anti-cardiolipin antibodies were negative. PTT-LA was slightly high at 53.2. DRVVT 30.5. No lupus anticoagulant was detected. Results are consistent with the presence of one of the factor specific inhibitors. Factors in question are factor VIII, IX, XI, and XII. PTT-LA mix high at 50.8. Hexagonal phase phospholipid normal range at 0.6. Protein S and C within normal range. Homocysteine plasma still pending. FANA still pending. CA-19-9 negative. CEA 0.7. PSA 0.9. Protein electrophoresis still pending. Beta 2 glycoprotein still pending. TSH 2.58. LDL 109, HDL 28, triglycerides 156.   Echocardiogram showed EF 55% to 60%, moderately elevated pulmonary arterial systolic pressure. Ultrasound of the lower extremity bilaterally shows a positive occlusive left distal femoral popliteal and tibial and peroneal DVT. Negative for right lower extremity DVT.    HOSPITAL COURSE PER PROBLEM LIST:  1.  Submassive pulmonary embolism bilaterally. The patient was seen in consultation by Dr. Sherrlyn HockPandit who sent off a hypercoagulable workup. Need to follow up with Dr. Sherrlyn HockPandit to see if there are any factor deficiencies. Length of treatment will depend on that. Will need at least 6 months but if there is a factor deficiency, may need it lifelong. The patient was seen in consultation by Dr. Gilda CreaseSchnier, vascular surgery. He believed the patient did not need a filter at this  time. The patient was seen in consultation by Dr. Meredeth Ide, pulmonary. No need for thrombolysis since the patient is hemodynamically stable. He felt that the patient can be treated with Xarelto. The patient was switched over to a Xarelto starter pack and was discharged  home in stable condition off oxygen. Further followup as outpatient. The patient received the card where he can get his co-pay for 3-dollars for the Xarelto. Will need followup prescriptions from either Dr. Sherrlyn Hock or [facility] on [address].   2.  Infection of the right foot. Follow up with Dr. Alberteen Spindle, podiatry. The patient is on Bactrim.  3.  Diabetes. Hemoglobin A1c 7.2 on previous hospitalization. Diet, exercise, and weight loss discussed at length.    TIME SPENT ON DISCHARGE: 35 minutes.    ____________________________ Herschell Dimes. Renae Gloss, MD rjw:at D: 03/06/2014 13:37:34 ET T: 03/06/2014 14:43:15 ET JOB#: [id number]  cc: Herschell Dimes. Renae Gloss, MD, <Dictator> [facility] Sandeep R. Sherrlyn Hock, MD Herbon E. Meredeth Ide, MD   Salley Scarlet MD ELECTRONICALLY SIGNED 03/30/2014 12:46

## 2014-10-04 NOTE — Consult Note (Signed)
PATIENT NAME:  James Odom, Mordechai MR#:  [id number] DATE OF BIRTH:  Sep 09, 1965  DATE OF CONSULTATION:  02/15/2014  REFERRING PHYSICIAN:   CONSULTING PHYSICIAN:  Linus Galasodd Milo Solana, DPM  REASON FOR CONSULTATION:  This is a 49 year old male with no significant prior medical history before his admission who was admitted for right foot erythema and swelling.  He states that this started this past week when he felt a soreness in his shoe while at work. When he took the shoe, it appeared to have rubbed the sore spot on the top of the toe. He did present to the Emergency Department this past week and was discharged on oral Bactrim.  He continued to have increased swelling and redness with pain and presented back to the Emergency Department where he was admitted for IV antibiotics and evaluation. Upon further questioning, he does relate an abscess on his neck within the past year that was drained and was told that this was MRSA.   PAST MEDICAL HISTORY: Recently diagnosed diabetes upon this admission, otherwise negative.   PAST SURGICAL HISTORY: Right elbow tendon repair.   MEDICATIONS: Bactrim DS and pain medications prior to his admission.   ALLERGIES: THE PLAGUE VACCINE.     FAMILY HISTORY: Renal failure in a brother and prostate cancer with his father.   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 Denies any alcohol or tobacco use.   REVIEW OF SYSTEMS:    CONSTITUTIONAL: Currently not having any fever or chills. Some generalized fatigue.  HEENT:  EYES: No blurred or double vision. ENT:  No tinnitus, hearing loss, or discharge.   RESPIRATORY: Denies any cough or shortness of breath.  CARDIOVASCULAR: Denies chest pain, arrhythmia, or palpitations.  GASTROINTESTINAL: Currently has some nausea that just started today. No vomiting or other abdominal pain.  GENITOURINARY:  No dysuria or incontinence.  NEUROLOGICAL: Denies any numbness or paresthesias in the feet.   MUSCULOSKELETAL: Denies any arthritis or  pains other than currently in his foot.  SKIN: Significant redness and swelling in the right foot present for the past week.   PHYSICAL EXAMINATION:  VASCULAR: DP and PT pulses are fully palpable. Capillary filling time is intact.  NEUROLOGICAL: Protective threshold with a monofilament wire is intact and symmetric. Proprioception is intact.  INTEGUMENT: The skin is warm, dry, and supple. There is significant erythema and edema in the right forefoot and the third toe. A dorsal scab is present over the base of the third toe. Upon removal, there is some fibrotic type tissue but no clear purulence could be expressed. The swelling on the dorsal forefoot does appear to be mildly fluctuant but limited examination due to pain.  MUSCULOSKELETAL: Significant guarding with range of motion in the right foot. Exquisite pain on any attempted palpation in the right. Normal range of motion in the left.   X-RAYS: Multiple views of the right foot reveal significant soft tissue edema, but there is no clear evidence of any cortical erosions, foreign bodies, or gas in the tissues.   IMPRESSION:  1.  Cellulitis, right foot, with abscess in the third toe and forefoot.  2.  Recently diagnosed diabetes.   PLAN: I attempted to debride the area on the right third toe which was very minimal. No clear purulence could be expressed. We will obtain an MRI for evaluation of the extent of the abscess and use this for surgical planning as he most likely will need an I and D of the right foot. Discussed with the  patient that there is always the possibility that bone could be involved but at this point would anticipate soft tissues. We will wait for the results of the MRI and then proceed accordingly from there.     ____________________________ Linus Galas, DPM tc:nr D: 02/15/2014 13:43:35 ET T: 02/15/2014 14:01:29 ET JOB#: [id number]  cc: Linus Galas, DPM, <Dictator> Destin Vinsant DPM ELECTRONICALLY SIGNED 03/03/2014 8:54

## 2014-12-11 ENCOUNTER — Other Ambulatory Visit: Payer: Self-pay | Admitting: Physician Assistant

## 2014-12-11 DIAGNOSIS — M545 Low back pain: Secondary | ICD-10-CM

## 2014-12-18 ENCOUNTER — Ambulatory Visit: Payer: Medicaid Other

## 2014-12-18 ENCOUNTER — Telehealth (HOSPITAL_COMMUNITY): Payer: Self-pay | Admitting: Radiology

## 2014-12-18 NOTE — Telephone Encounter (Signed)
Early CharsKenisha Odom left a vm yesterday that his procedure had been cancelled bc the insurance denied it.  The message also included to f/u with his dr.

## 2015-04-12 ENCOUNTER — Emergency Department
Admission: EM | Admit: 2015-04-12 | Discharge: 2015-04-12 | Disposition: A | Discharge status: Home / Self Care | Payer: Medicaid Other | Attending: Emergency Medicine | Admitting: Emergency Medicine

## 2015-04-12 ENCOUNTER — Encounter: Payer: Self-pay | Admitting: Emergency Medicine

## 2015-04-12 DIAGNOSIS — I10 Essential (primary) hypertension: Secondary | ICD-10-CM | POA: Diagnosis not present

## 2015-04-12 DIAGNOSIS — B029 Zoster without complications: Secondary | ICD-10-CM | POA: Diagnosis not present

## 2015-04-12 DIAGNOSIS — R21 Rash and other nonspecific skin eruption: Secondary | ICD-10-CM | POA: Diagnosis present

## 2015-04-12 DIAGNOSIS — E119 Type 2 diabetes mellitus without complications: Secondary | ICD-10-CM | POA: Insufficient documentation

## 2015-04-12 HISTORY — DX: Type 2 diabetes mellitus without complications: E11.9

## 2015-04-12 HISTORY — DX: Essential (primary) hypertension: I10

## 2015-04-12 MED ORDER — VALACYCLOVIR HCL 1 G PO TABS: 1000.0000 mg | ORAL_TABLET | Freq: Three times a day (TID) | ORAL | Status: AC

## 2015-04-12 MED ORDER — OXYCODONE-ACETAMINOPHEN 5-325 MG PO TABS: 1.0000 | ORAL_TABLET | Freq: Four times a day (QID) | ORAL | Status: AC | PRN

## 2015-04-12 NOTE — ED Notes (Signed)
Developed to left side of chest ..states rash is painful and started on tuesday

## 2015-04-12 NOTE — Discharge Instructions (Signed)
Shingles °Shingles, which is also known as herpes zoster, is an infection that causes a painful skin rash and fluid-filled blisters. Shingles is not related to genital herpes, which is a sexually transmitted infection.  ° °Shingles only develops in people who: °· Have had chickenpox. °· Have received the chickenpox vaccine. (This is rare.) °CAUSES °Shingles is caused by varicella-zoster virus (VZV). This is the same virus that causes chickenpox. After exposure to VZV, the virus stays in the body in an inactive (dormant) state. Shingles develops if the virus reactivates. This can happen many years after the initial exposure to VZV. It is not known what causes this virus to reactivate. °RISK FACTORS °People who have had chickenpox or received the chickenpox vaccine are at risk for shingles. Infection is more common in people who: °· Are older than age 50. °· Have a weakened defense (immune) system, such as those with HIV, AIDS, or cancer. °· Are taking medicines that weaken the immune system, such as transplant medicines. °· Are under great stress. °SYMPTOMS °Early symptoms of this condition include itching, tingling, and pain in an area on your skin. Pain may be described as burning, stabbing, or throbbing. °A few days or weeks after symptoms start, a painful red rash appears, usually on one side of the body in a bandlike or beltlike pattern. The rash eventually turns into fluid-filled blisters that break open, scab over, and dry up in about 2-3 weeks. °At any time during the infection, you may also develop: °· A fever. °· Chills. °· A headache. °· An upset stomach. °DIAGNOSIS °This condition is diagnosed with a skin exam. Sometimes, skin or fluid samples are taken from the blisters before a diagnosis is made. These samples are examined under a microscope or sent to a lab for testing. °TREATMENT °There is no specific cure for this condition. Your health care provider will probably prescribe medicines to help you  manage pain, recover more quickly, and avoid long-term problems. Medicines may include: °· Antiviral drugs. °· Anti-inflammatory drugs. °· Pain medicines. °If the area involved is on your face, you may be referred to a specialist, such as an eye doctor (ophthalmologist) or an ear, nose, and throat (ENT) doctor to help you avoid eye problems, chronic pain, or disability. °HOME CARE INSTRUCTIONS °Medicines °· Take medicines only as directed by your health care provider. °· Apply an anti-itch or numbing cream to the affected area as directed by your health care provider. °Blister and Rash Care °· Take a cool bath or apply cool compresses to the area of the rash or blisters as directed by your health care provider. This may help with pain and itching. °· Keep your rash covered with a loose bandage (dressing). Wear loose-fitting clothing to help ease the pain of material rubbing against the rash. °· Keep your rash and blisters clean with mild soap and cool water or as directed by your health care provider. °· Check your rash every day for signs of infection. These include redness, swelling, and pain that lasts or increases. °· Do not pick your blisters. °· Do not scratch your rash. °General Instructions °· Rest as directed by your health care provider. °· Keep all follow-up visits as directed by your health care provider. This is important. °· Until your blisters scab over, your infection can cause chickenpox in people who have never had it or been vaccinated against it. To prevent this from happening, avoid contact with other people, especially: °· Babies. °· Pregnant women. °· Children   who have eczema. °· Elderly people who have transplants. °· People who have chronic illnesses, such as leukemia or AIDS. °SEEK MEDICAL CARE IF: °· Your pain is not relieved with prescribed medicines. °· Your pain does not get better after the rash heals. °· Your rash looks infected. Signs of infection include redness, swelling, and pain  that lasts or increases. °SEEK IMMEDIATE MEDICAL CARE IF: °· The rash is on your face or nose. °· You have facial pain, pain around your eye area, or loss of feeling on one side of your face. °· You have ear pain or you have ringing in your ear. °· You have loss of taste. °· Your condition gets worse. °  °This information is not intended to replace advice given to you by your health care provider. Make sure you discuss any questions you have with your health care provider. °  °Document Released: 05/30/2005 Document Revised: 06/20/2014 Document Reviewed: 04/10/2014 °Elsevier Interactive Patient Education ©2016 [employer] ° °Neuropathic Pain °Neuropathic pain is pain caused by damage to the nerves that are responsible for certain sensations in your body (sensory nerves). The pain can be caused by damage to:  °· The sensory nerves that send signals to your spinal cord and brain (peripheral nervous system). °· The sensory nerves in your brain or spinal cord (central nervous system). °Neuropathic pain can make you more sensitive to pain. What would be a minor sensation for most people may feel very painful if you have neuropathic pain. This is usually a long-term condition that can be difficult to treat. The type of pain can differ from person to person. It may start suddenly (acute), or it may develop slowly and last for a long time (chronic). Neuropathic pain may come and go as damaged nerves heal or may stay at the same level for years. It often causes emotional distress, loss of sleep, and a lower quality of life. °CAUSES  °The most common cause of damage to a sensory nerve is diabetes. Many other diseases and conditions can also cause neuropathic pain. Causes of neuropathic pain can be classified as: °· Toxic. Many drugs and chemicals can cause toxic damage. The most common cause of toxic neuropathic pain is damage from drug treatment for cancer (chemotherapy). °· Metabolic. This type of pain can happen when a  disease causes imbalances that damage nerves. Diabetes is the most common of these diseases. Vitamin B deficiency caused by long-term alcohol abuse is another common cause. °· Traumatic. Any injury that cuts, crushes, or stretches a nerve can cause damage and pain. A common example is feeling pain after losing an arm or leg (phantom limb pain). °· Compression-related. If a sensory nerve gets trapped or compressed for a long period of time, the blood supply to the nerve can be cut off. °· Vascular. Many blood vessel diseases can cause neuropathic pain by decreasing blood supply and oxygen to nerves. °· Autoimmune. This type of pain results from diseases in which the body's defense system mistakenly attacks sensory nerves. Examples of autoimmune diseases that can cause neuropathic pain include lupus and multiple sclerosis. °· Infectious. Many types of viral infections can damage sensory nerves and cause pain. Shingles infection is a common cause of this type of pain. °· Inherited. Neuropathic pain can be a symptom of many diseases that are passed down through families (genetic). °SIGNS AND SYMPTOMS  °The main symptom is pain. Neuropathic pain is often described as: °· Burning. °· Shock-like. °· Stinging. °· Hot or cold. °·   Itching. °DIAGNOSIS  °No single test can diagnose neuropathic pain. Your health care provider will do a physical exam and ask you about your pain. You may use a pain scale to describe how bad your pain is. You may also have tests to see if you have a high sensitivity to pain and to help find the cause and location of any sensory nerve damage. These tests may include: °· Imaging studies, such as: °¨ X-rays. °¨ CT scan. °¨ MRI. °· Nerve conduction studies to test how well nerve signals travel through your sensory nerves (electrodiagnostic testing). °· Stimulating your sensory nerves through electrodes on your skin and measuring the response in your spinal cord and brain (somatosensory evoked  potentials). °TREATMENT  °Treatment for neuropathic pain may change over time. You may need to try different treatment options or a combination of treatments. Some options include: °· Over-the-counter pain relievers. °· Prescription medicines. Some medicines used to treat other conditions may also help neuropathic pain. These include medicines to: °¨ Control seizures (anticonvulsants). °¨ Relieve depression (antidepressants). °· Prescription-strength pain relievers (narcotics). These are usually used when other pain relievers do not help. °· Transcutaneous nerve stimulation (TENS). This uses electrical currents to block painful nerve signals. The treatment is painless. °· Topical and local anesthetics. These are medicines that numb the nerves. They can be injected as a nerve block or applied to the skin. °· Alternative treatments, such as: °¨ Acupuncture. °¨ Meditation. °¨ Massage. °¨ Physical therapy. °¨ Pain management programs. °¨ Counseling. °HOME CARE INSTRUCTIONS °· Learn as much as you can about your condition. °· Take medicines only as directed by your health care provider. °· Work closely with all your health care providers to find what works best for you. °· Have a good support system at home. °· Consider joining a chronic pain support group. °SEEK MEDICAL CARE IF: °· Your pain treatments are not helping. °· You are having side effects from your medicines. °· You are struggling with fatigue, mood changes, depression, or anxiety. °  °This information is not intended to replace advice given to you by your health care provider. Make sure you discuss any questions you have with your health care provider. °  °Document Released: 02/25/2004 Document Revised: 06/20/2014 Document Reviewed: 11/07/2013 °Elsevier Interactive Patient Education ©2016 [employer] ° °

## 2015-04-12 NOTE — ED Notes (Signed)
Pt arrives to ED with worsening rash across left side and back x 2 days; complaints of worsening pain with rash, unable to sleep at night due to intense pain.

## 2015-04-12 NOTE — ED Provider Notes (Signed)
[facility] Emergency Department Provider Note  ____________________________________________  Time seen: Approximately 9:55 AM  I have reviewed the triage vital signs and the nursing notes.   HISTORY  Chief Complaint Rash    HPI James Odom is a 49 y.o. male who presents to emergency department complaining of a very painful rash to left ribs. He states that initially the symptoms began with a burning sensation underneath the skin has since manifested to a red, painful rash that runs along left ribs. He states the pain is so severe that he is unable to sleep. He denies any fevers or chills, facial involvement, sore throat, eye pain, pain or shortness of breath. He does have a history of chickenpox as a child. He has not received the shingles vaccine. Patient states that he has been taking significant amounts of ibuprofen and Aleve with no relief. Tried topical ointments with no relief as well. Pain is severe, burning sensation, and worse with touch.   Past Medical History  Diagnosis Date  . Diabetes mellitus without complication (HCC)   . Hypertension     There are no active problems to display for this patient.   History reviewed. No pertinent past surgical history.  Current Outpatient Rx  Name  Route  Sig  Dispense  Refill  . oxyCODONE-acetaminophen (ROXICET) 5-325 MG tablet   Oral   Take 1-2 tablets by mouth every 6 (six) hours as needed for severe pain.   30 tablet   0   . valACYclovir (VALTREX) 1000 MG tablet   Oral   Take 1 tablet (1,000 mg total) by mouth 3 (three) times daily.   21 tablet   0     Allergies Review of patient's allergies indicates no known allergies.  No family history on file.  Social History Social History  Substance Use Topics  . Smoking status: Never Smoker   . Smokeless tobacco: None  . Alcohol Use: No    Review of Systems Constitutional: No fever/chills Eyes: No visual changes. ENT: No sore  throat. Cardiovascular: Denies chest pain. Respiratory: Denies shortness of breath. Gastrointestinal: No abdominal pain.  No nausea, no vomiting.  No diarrhea.  No constipation. Genitourinary: Negative for dysuria. Musculoskeletal: Negative for back pain. Skin: Endorses rash to left ribs. Neurological: Negative for headaches, focal weakness or numbness.  10-point ROS otherwise negative.  ____________________________________________   PHYSICAL EXAM:  VITAL SIGNS: ED Triage Vitals  Enc Vitals Group     BP 04/12/15 0908 139/98 mmHg     Pulse Rate 04/12/15 0908 81     Resp 04/12/15 0908 20     Temp 04/12/15 0908 98 F (36.7 C)     Temp Source 04/12/15 0908 Oral     SpO2 04/12/15 0908 97 %     Weight 04/12/15 0908 225 lb (102.059 kg)     Height 04/12/15 0908 5\' 10"  (1.778 m)     Head Cir --      Peak Flow --      Pain Score 04/12/15 0908 5     Pain Loc --      Pain Edu? --      Excl. in GC? --     Constitutional: Alert and oriented. Well appearing and in no acute distress. Eyes: Conjunctivae are normal. PERRL. EOMI. Head: Atraumatic. Nose: No congestion/rhinnorhea. Mouth/Throat: Mucous membranes are moist.  Oropharynx non-erythematous. Neck: No stridor.   Hematological/Lymphatic/Immunilogical: No cervical lymphadenopathy. Cardiovascular: Normal rate, regular rhythm. Grossly normal heart sounds.  Good peripheral  circulation. Respiratory: Normal respiratory effort.  No retractions. Lungs CTAB. Gastrointestinal: Soft and nontender. No distention. No abdominal bruits. No CVA tenderness. Musculoskeletal: No lower extremity tenderness nor edema.  No joint effusions. Neurologic:  Normal speech and language. No gross focal neurologic deficits are appreciated. No gait instability. Skin:  Skin is warm, dry and intact. Erythematous lesions noted in dermatomal distribution. Rashes very tender to palpation. Lesions are not open or weeping at this time. Dermatomal distribution is  approximately T6 level. She really shouldn't is all on patient's left side. Psychiatric: Mood and affect are normal. Speech and behavior are normal.  ____________________________________________   LABS (all labs ordered are listed, but only abnormal results are displayed)  Labs Reviewed - No data to display ____________________________________________  EKG   ____________________________________________  RADIOLOGY   ____________________________________________   PROCEDURES  Procedure(s) performed: None  Critical Care performed: No  ____________________________________________   INITIAL IMPRESSION / ASSESSMENT AND PLAN / ED COURSE  Pertinent labs & imaging results that were available during my care of the patient were reviewed by me and considered in my medical decision making (see chart for details).  Patient's history, symptoms, physical exam are consistent with shingles. I advised the patient of findings and diagnosis and he verbalizes understanding. I'll place the patient on antivirals as well as narcotic pain medications. Advised the patient to follow-up with his primary care in the morning for continued treatment past emergency department course. 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 The patient verbalizes that he would like to continue to work, so I have advised the patient to cover all lesions and practice good hand hygiene after covering same. The patient is to follow-up with primary care for any necessary time off due to neuropathy pain and follow-up prescription for pain medication. The patient verbalizes understanding of treatment plan and verbalizes compliance with same. ____________________________________________   FINAL CLINICAL IMPRESSION(S) / ED DIAGNOSES  Final diagnoses:  Shingles      Racheal Patches, PA-C 04/12/15 1019  Sharyn Creamer, MD 04/12/15 1600

## 2016-06-28 IMAGING — CT CT MAXILLOFACIAL WITHOUT CONTRAST
4 of 10 series · 16 of 47 positions shown, 18 images · non-contrast
Comparison: Temporal bone CT from the same day reported separately.

CLINICAL DATA: 47-year-old male with right ear pain for 6 weeks.
Fever, tenderness at the right TMJ. Query infection. Initial
encounter.

EXAM:
CT MAXILLOFACIAL WITHOUT CONTRAST
TECHNIQUE: Multidetector CT imaging of the maxillofacial structures was
performed. Multiplanar CT image reconstructions were also generated.
A small metallic BB was placed on the right temple in order to
reliably differentiate right from left.

[Series 5: sagittal soft · sagittal · 0.31mm/px · 3 of 82 slices shown]
[im 28/82  bone]
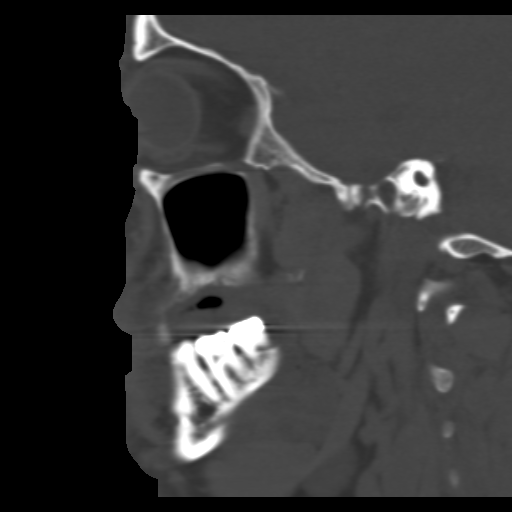
[im 41/82  bone]
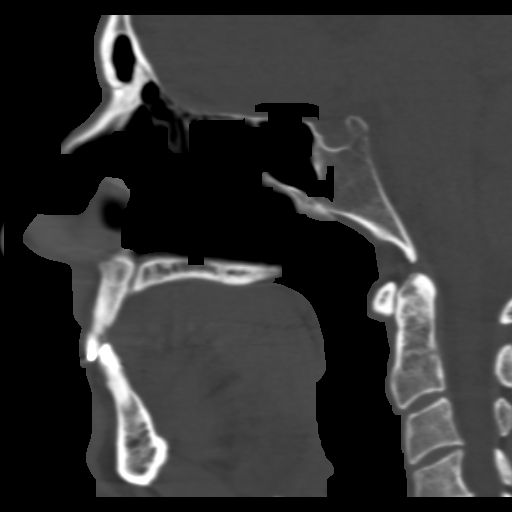
[im 55/82  bone]
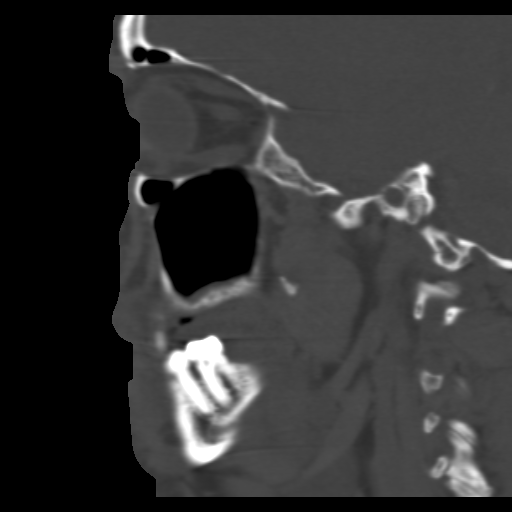

[Series 10: coronal bone · coronal · 0.17mm/px · 1 of 264 slices shown]
[im 132/264  bone]
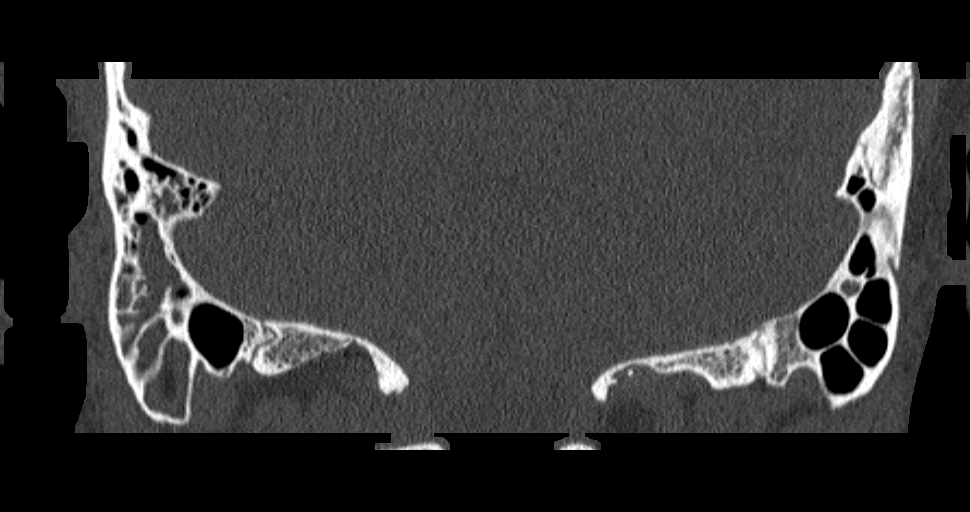

[Series 11: ax mag right · axial · 0.17mm/px · z∈[-169,-118]mm · 8 of 108 slices shown, 10 images]
[im 12/108  brain]
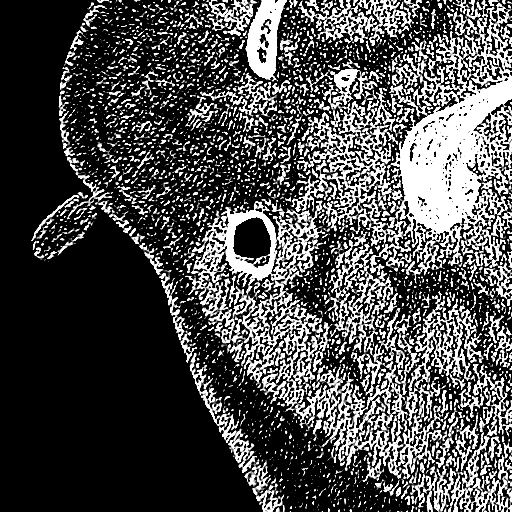
[im 12/108  bone]
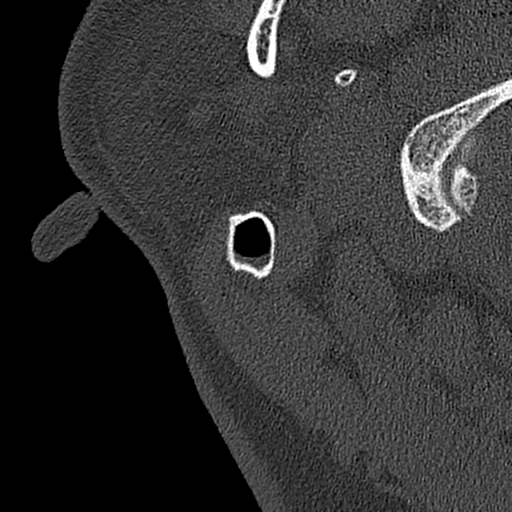
[im 24/108  bone]
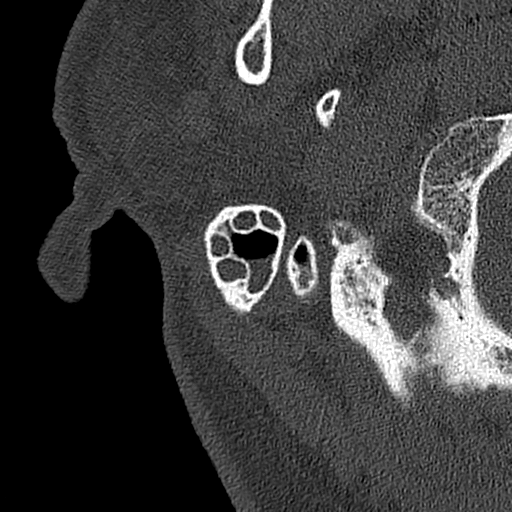
[im 36/108  bone]
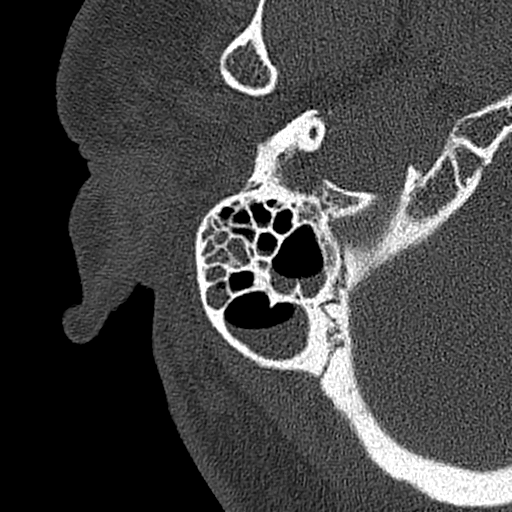
[im 48/108  bone]
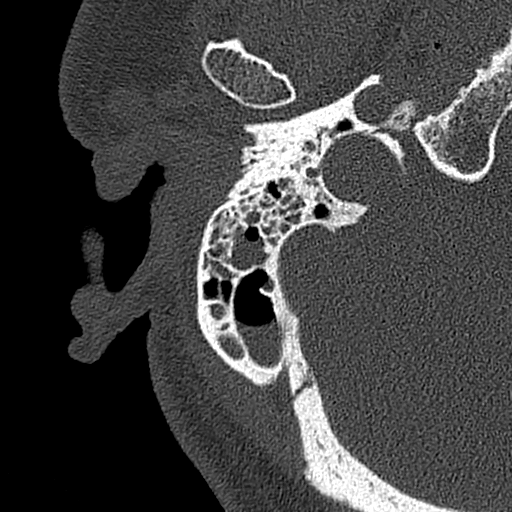
[im 60/108  brain]
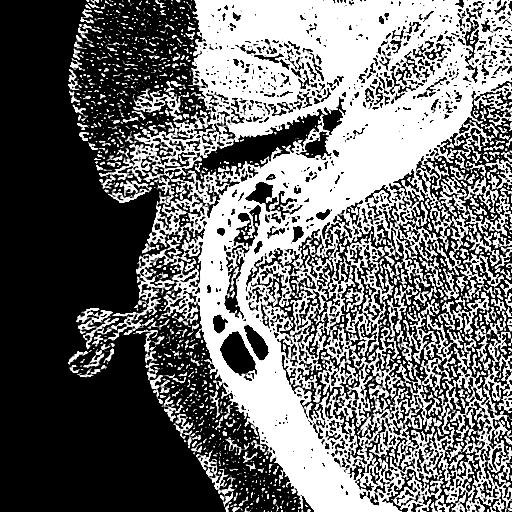
[im 60/108  bone]
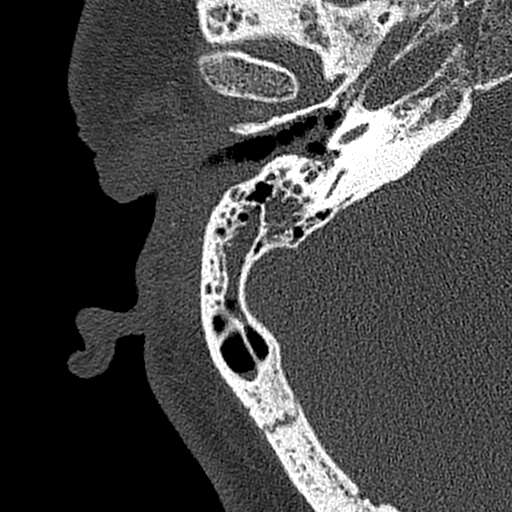
[im 72/108  bone]
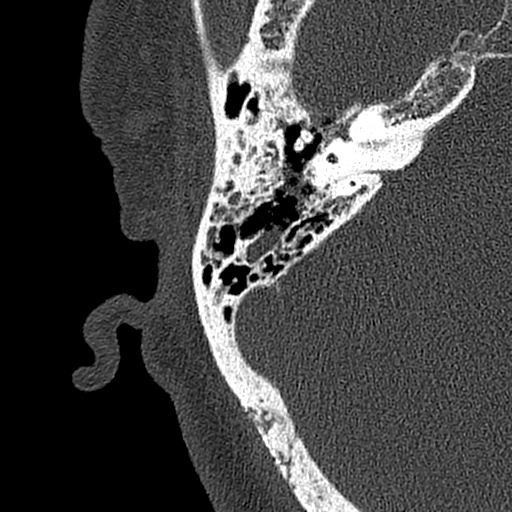
[im 84/108  bone]
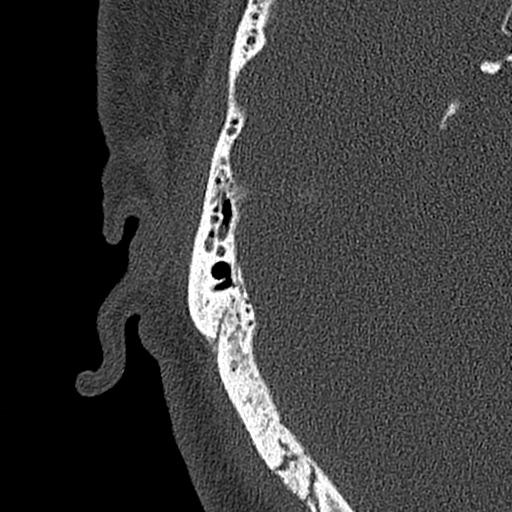
[im 96/108  bone]
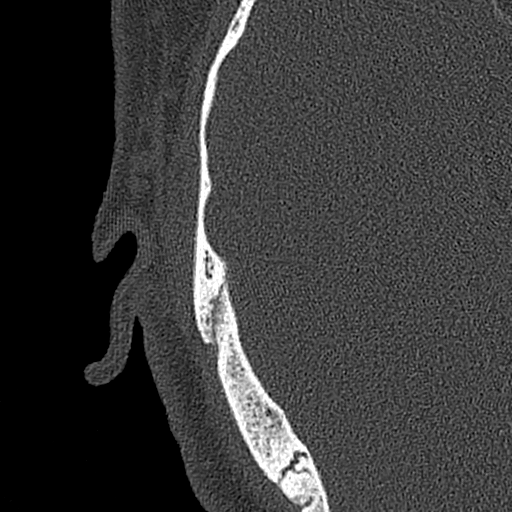

[Series 13: ax mag left · axial · 0.18mm/px · z∈[-169,-147]mm · 4 of 108 slices shown]
[im 12/108  bone]
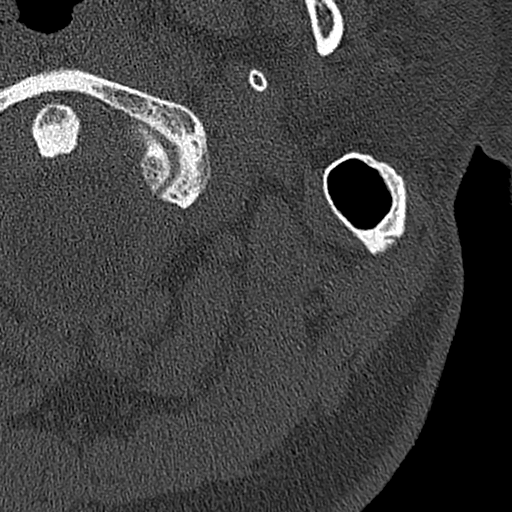
[im 24/108  bone]
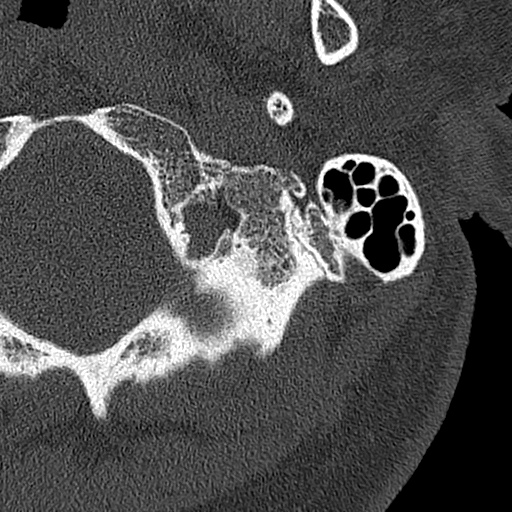
[im 36/108  bone]
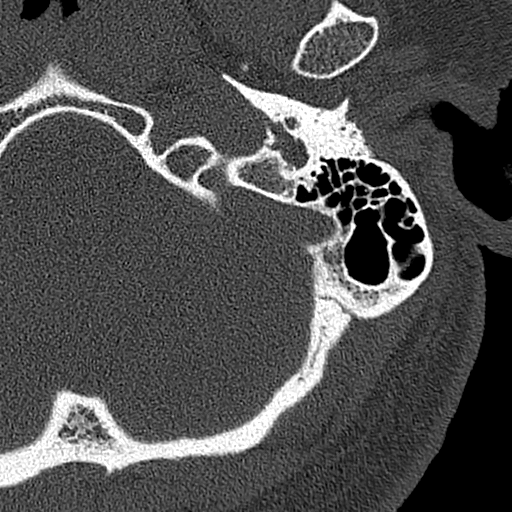
[im 48/108  bone]
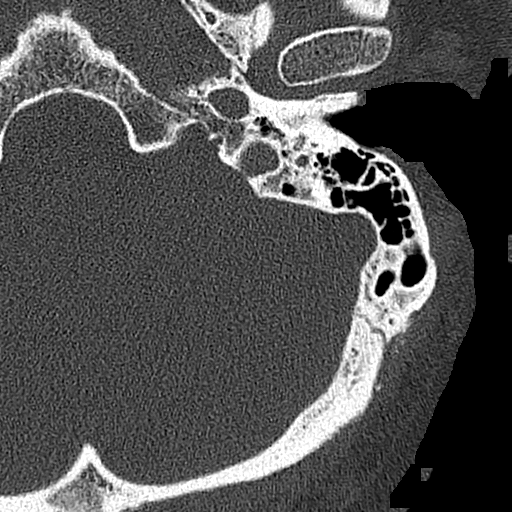

[16 of 47 positions shown; findings below may reference images not displayed]

FINDINGS: See temporal bone findings from today reported separately.

Negative visualized non contrast brain parenchyma. Visualized orbit
soft tissues are within normal limits. Visualized scalp soft tissues
are within normal limits. The visible right pinna appears within
normal limits.

Visible non contrast deep soft tissue spaces of the face appear
symmetric and within normal limits.

Mandible intact. No CT changes at the right TMJ. Intact skullbase.
Paranasal sinuses are clear aside from minimal maxillary mucosal
thickening bilaterally. No acute osseous abnormality identified.
IMPRESSION: 1. See abnormal right temporal bone findings reported separately.
2. Otherwise negative non contrast CT appearance of the face.
Negative CT appearance of the right TMJ.

## 2016-08-08 IMAGING — US US EXTREM LOW VENOUS BILAT
1 series · 13 of 24 positions shown · non-contrast
Comparison: None.

CLINICAL DATA: Acute pulmonary emboli



[Series 1: us extrem low venous bilat · 0.08mm/px · 13 of 83 slices shown]
[im 1/83]
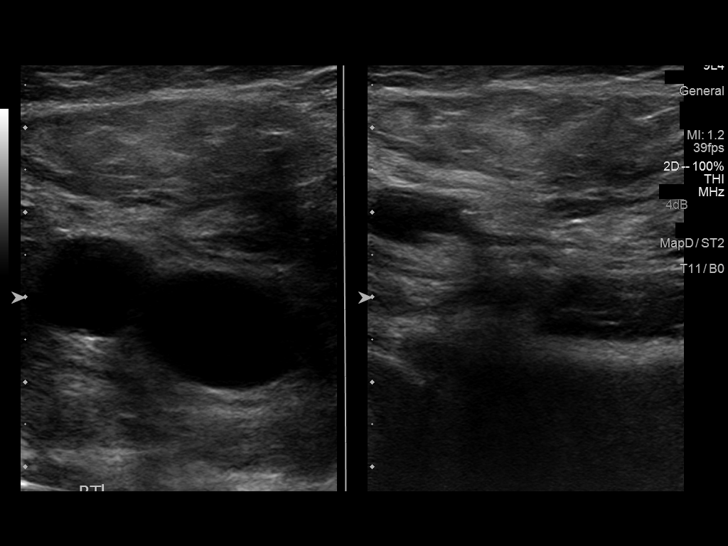
[im 8/83]
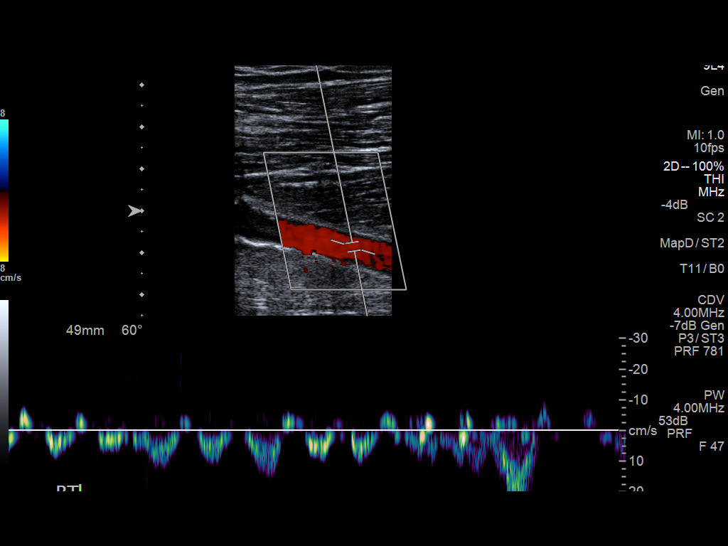
[im 15/83]
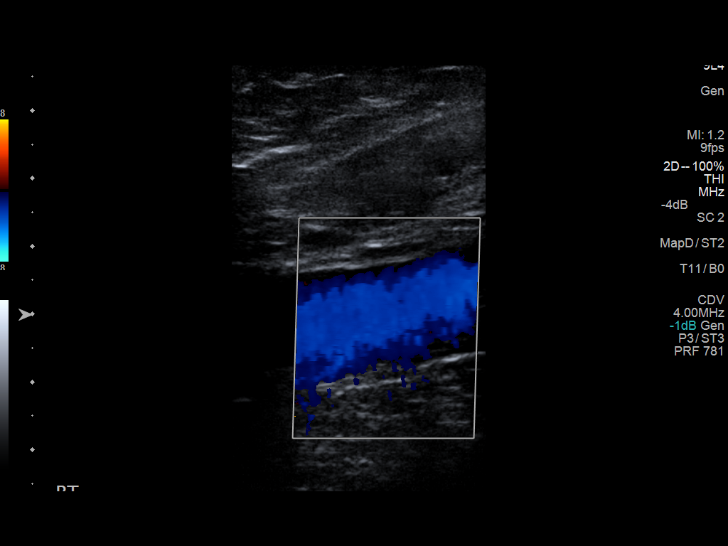
[im 22/83]
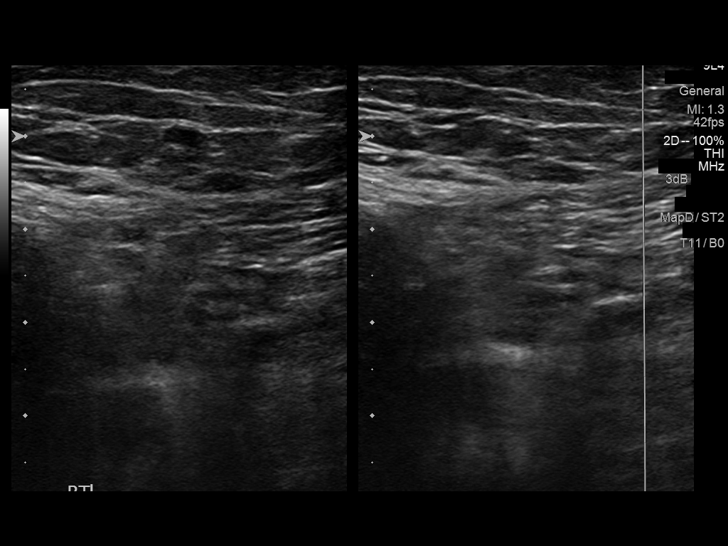
[im 29/83]
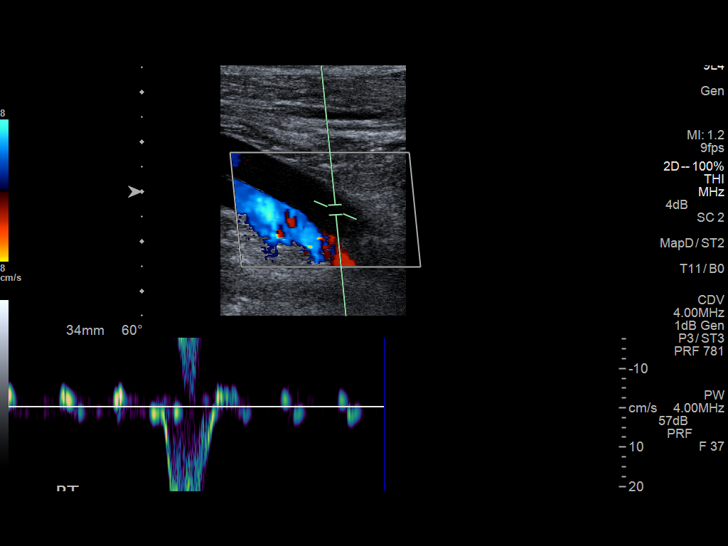
[im 36/83]
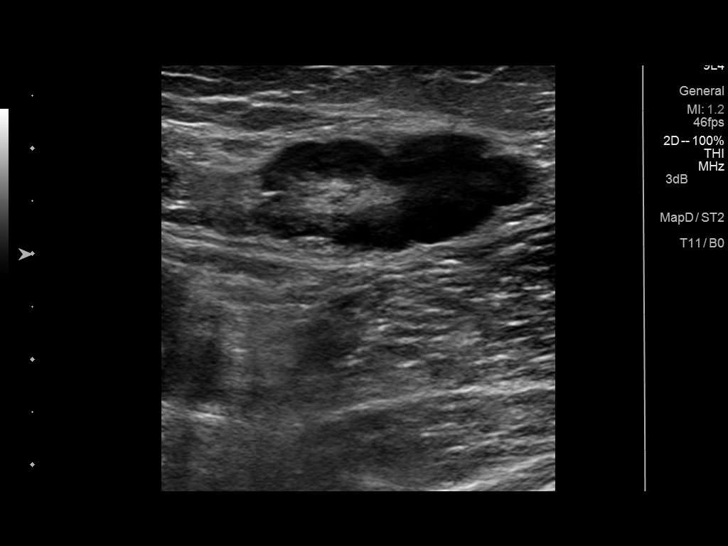
[im 43/83]
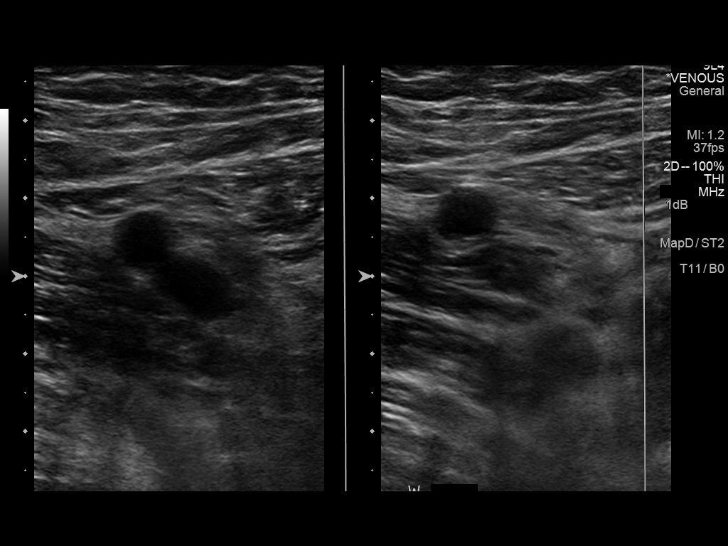
[im 47/83]
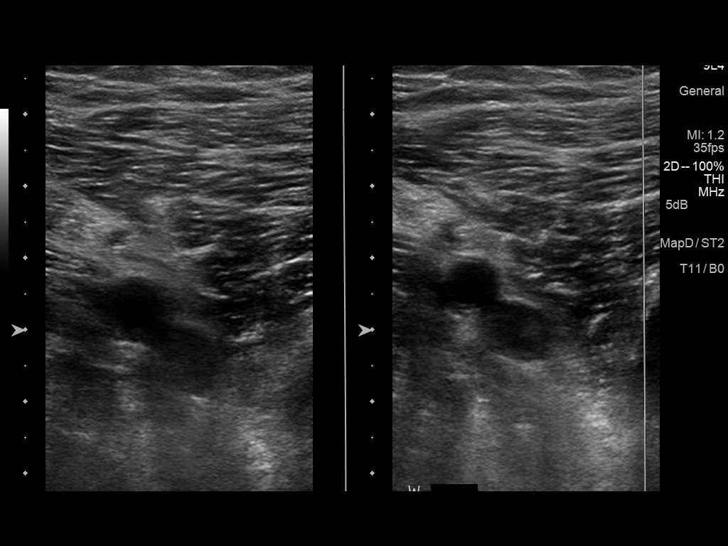
[im 54/83]
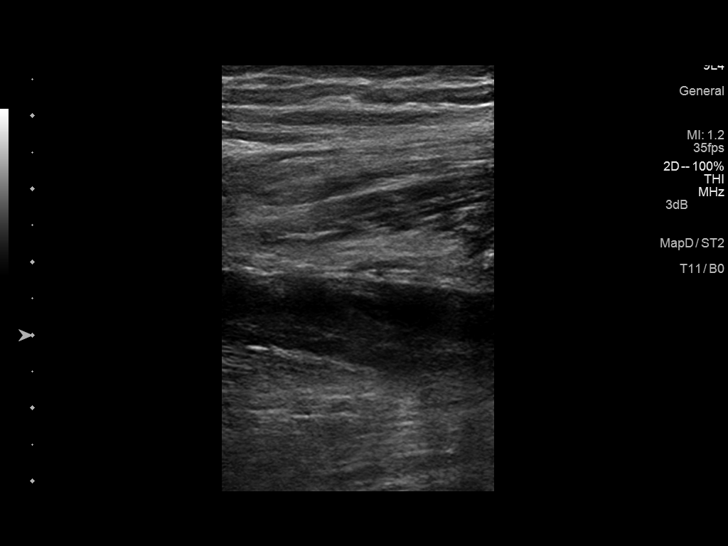
[im 61/83]
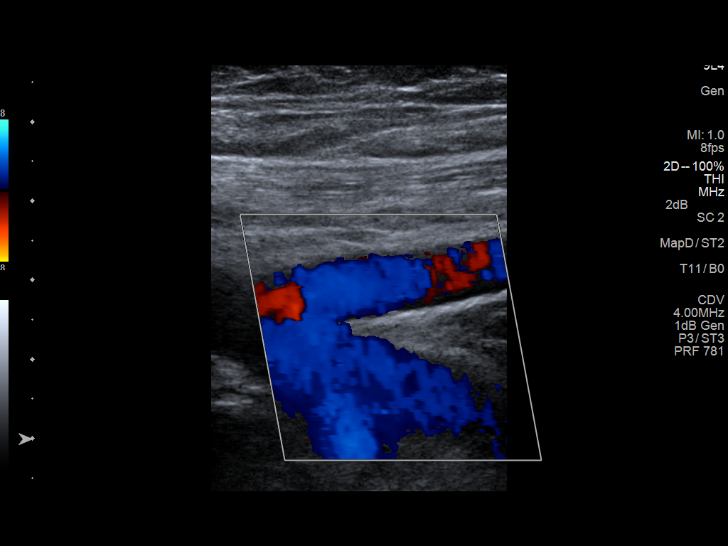
[im 68/83]
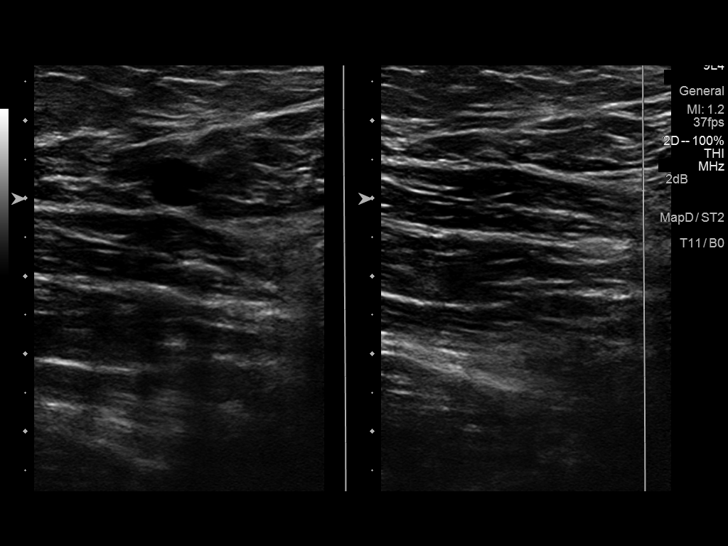
[im 75/83]
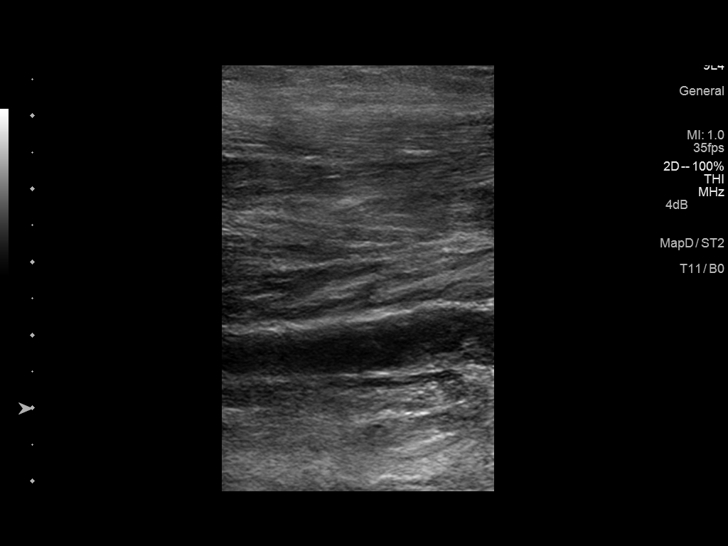
[im 83/83]
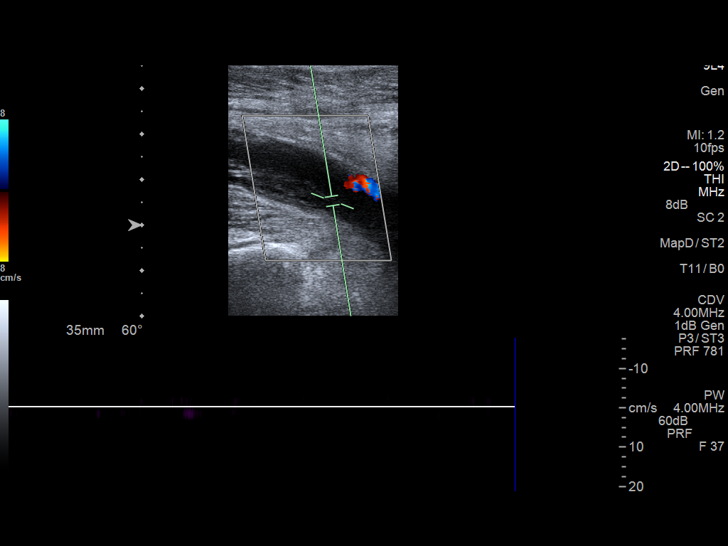

[13 of 24 positions shown; findings below may reference images not displayed]

FINDINGS: RIGHT LOWER EXTREMITY

Common Femoral Vein: No evidence of thrombus. Normal
compressibility, respiratory phasicity and response to augmentation.

Saphenofemoral Junction: No evidence of thrombus. Normal
compressibility and flow on color Doppler imaging.

Profunda Femoral Vein: No evidence of thrombus. Normal
compressibility and flow on color Doppler imaging.

Femoral Vein: No evidence of thrombus. Normal compressibility,
respiratory phasicity and response to augmentation.

Popliteal Vein: No evidence of thrombus. Normal compressibility,
respiratory phasicity and response to augmentation.

Calf Veins: No evidence of thrombus. Normal compressibility and flow
on color Doppler imaging.

Superficial Great Saphenous Vein: No evidence of thrombus. Normal
compressibility and flow on color Doppler imaging.

Venous Reflux:  None.

Other Findings:  None.

LEFT LOWER EXTREMITY

Common Femoral Vein: No evidence of thrombus. Normal
compressibility, respiratory phasicity and response to augmentation.

Saphenofemoral Junction: No evidence of thrombus. Normal
compressibility and flow on color Doppler imaging.

Profunda Femoral Vein: No evidence of thrombus. Normal
compressibility and flow on color Doppler imaging.

Femoral Vein: Left distal femoral vein demonstrates intraluminal
echogenic thrombus appearing acute which is noncompressible.
Thrombus is also occlusive.

Popliteal Vein: Femoral thrombus extends into the popliteal vein
which is noncompressible and occlusive.

Calf Veins: Posterior tibial and peroneal calf veins also
demonstrate occlusive thrombus.

Superficial Great Saphenous Vein: No evidence of thrombus. Normal
compressibility and flow on color Doppler imaging.

Venous Reflux:  None.

Other Findings: Enlarged proximal right thigh lymph node measures
3.2 x 1.0 x 2.7 cm. Although the lymph node is enlarged at has a
preserved cortex and fatty hilum, suspect reactive.
IMPRESSION: Positive exam for acute occlusive left distal femoral, popliteal,
tibial and peroneal DVT.

Negative for right lower extremity DVT.
